# Patient Record
Sex: Female | Born: 1990 | Race: White | Hispanic: No | Marital: Single | Smoking: Never smoker
Health system: Southern US, Community
[De-identification: ages and names within clinical notes are randomized; demographics above are authoritative.]

## PROBLEM LIST (undated history)

## (undated) DIAGNOSIS — I10 Essential (primary) hypertension: Secondary | ICD-10-CM

## (undated) HISTORY — DX: Essential (primary) hypertension: I10

## (undated) HISTORY — PX: WISDOM TOOTH EXTRACTION: SHX21

---

## 2017-07-10 ENCOUNTER — Other Ambulatory Visit: Payer: Self-pay | Admitting: Family Medicine

## 2017-07-10 DIAGNOSIS — R109 Unspecified abdominal pain: Secondary | ICD-10-CM

## 2017-07-16 ENCOUNTER — Ambulatory Visit
Admission: RE | Admit: 2017-07-16 | Discharge: 2017-07-16 | Disposition: A | Discharge status: Home / Self Care | Payer: PRIVATE HEALTH INSURANCE | Source: Ambulatory Visit | Attending: Family Medicine | Admitting: Family Medicine

## 2017-07-16 DIAGNOSIS — R109 Unspecified abdominal pain: Secondary | ICD-10-CM

## 2017-07-24 ENCOUNTER — Encounter: Payer: Self-pay | Admitting: General Surgery

## 2017-08-07 ENCOUNTER — Encounter: Payer: Self-pay | Admitting: *Deleted

## 2017-08-09 ENCOUNTER — Encounter: Payer: Self-pay | Admitting: General Surgery

## 2017-08-09 ENCOUNTER — Ambulatory Visit: Payer: PRIVATE HEALTH INSURANCE | Admitting: General Surgery

## 2017-08-09 VITALS — BP 152/86 | HR 90 | Resp 14 | Ht 66.0 in | Wt 249.0 lb

## 2017-08-09 DIAGNOSIS — K8020 Calculus of gallbladder without cholecystitis without obstruction: Secondary | ICD-10-CM

## 2017-08-09 NOTE — Progress Notes (Signed)
Patient ID: Tiffany Harrington, female   DOB: 10/04/1990, 26 y.o.   MRN: [id number]  Chief Complaint  Patient presents with  . Abdominal Pain    HPI Tiffany Harrington is a 26 y.o. female.  Here for evaluation of gall stones referred by [name] PA. She states she has had at least 3 episodes of right upper abdominal pain this year starting before the summer. The pain last at least a day and radiates to her back. She thinks it is triggered by spicy or fatty foods. Her last episode was 6 weeks ago. Ultrasound was 07-16-17. She states the vomiting can be during the night or early am.  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  HPI  Past Medical History:  Diagnosis Date  . Hypertension       Family History  Problem Relation Age of Onset  . Diabetes Mother     Social History Social History   Tobacco Use  . Smoking status: Never Smoker  . Smokeless tobacco: Never Used  Substance Use Topics  . Alcohol use: No    Frequency: Never  . Drug use: No    No Known Allergies  Current Outpatient Medications  Medication Sig Dispense Refill  . amphetamine-dextroamphetamine (ADDERALL XR) 15 MG 24 hr capsule Take 15 mg by mouth.    . hydrochlorothiazide (HYDRODIURIL) 25 MG tablet Take 25 mg by mouth daily.    . norgestimate-ethinyl estradiol (ORTHO-CYCLEN,SPRINTEC,PREVIFEM) 0.25-35 MG-MCG tablet Take by mouth.     No current facility-administered medications for this visit.     Review of Systems Review of Systems  Constitutional: Negative.   Respiratory: Negative.   Cardiovascular: Negative.   Gastrointestinal: Positive for abdominal pain, constipation and diarrhea.    Blood pressure (!) 152/86, pulse 90, resp. rate 14, height 5\' 6"  (1.676 m), weight 249 lb (112.9 kg), last menstrual period 08/03/2017, SpO2 98 %.  Physical Exam Physical Exam  Constitutional: She is oriented to person, place, and time. She appears well-developed and  well-nourished.  HENT:  Mouth/Throat: Oropharynx is clear and moist.  Eyes: Conjunctivae are normal. No scleral icterus.  Neck: Neck supple.  Cardiovascular: Normal rate, regular rhythm and normal heart sounds.  Pulmonary/Chest: Effort normal and breath sounds normal.  Abdominal: Soft. Bowel sounds are normal. There is tenderness.  Lymphadenopathy:    She has no cervical adenopathy.  Neurological: She is alert and oriented to person, place, and time.  Skin: Skin is warm and dry.  Psychiatric: Her behavior is normal.    Data Reviewed 07/16/2017 abdominal ultrasound: IMPRESSION: 1.4 cm gallstone in the gallbladder neck which does not move with change in positioning. No ultrasound evidence of cholecystitis or frank obstruction presently. No associated laboratory studies available.  Assessment    Symptomatic cholelithiasis.    Plan    Options for management reviewed. Low likelihood of success with oral dissolution therapy reviewed large stone.  Patient is up in the area at this time whether she wants to proceed with cholecystectomy.    Laparoscopic Cholecystectomy with Intraoperative Cholangiogram. The procedure, including it's potential risks and complications (including but not limited to infection, bleeding, injury to intra-abdominal organs or bile ducts, bile leak, poor cosmetic result, sepsis and death) were discussed with the patient in detail. Non-operative options, including their inherent risks (acute calculous cholecystitis with possible choledocholithiasis or gallstone pancreatitis, with the risk of ascending cholangitis, sepsis, and death) were discussed as well. The patient expressed and understanding of what we  discussed and wishes to callback when she wishes to proceed with laparoscopic cholecystectomy. The patient further understands that if it is technically not possible, or it is unsafe to proceed laparoscopically, that I will convert to an open  cholecystectomy.  HPI, Physical Exam, Assessment and Plan have been scribed under the direction and in the presence of Earline MayotteJeffrey W. Ryota Treece, MD. Dorathy DaftMarsha Hatch, RN  .Warren Danesasa3  Lona Six, Merrily PewJeffrey W 08/09/2017, 8:25 PM  The patient wishes to think about surgery at this time and call the office if she would like to proceed. She was provided with phone interview instructions today.   Nicholes MangoMichele J. Bailey, CMA

## 2017-08-09 NOTE — Patient Instructions (Signed)

## 2017-12-26 ENCOUNTER — Ambulatory Visit: Payer: Self-pay | Admitting: Family Medicine

## 2018-03-29 ENCOUNTER — Emergency Department (HOSPITAL_COMMUNITY)
Admission: EM | Admit: 2018-03-29 | Discharge: 2018-03-30 | Disposition: A | Discharge status: Home / Self Care | Payer: PRIVATE HEALTH INSURANCE | Attending: Emergency Medicine | Admitting: Emergency Medicine

## 2018-03-29 ENCOUNTER — Other Ambulatory Visit: Payer: Self-pay

## 2018-03-29 ENCOUNTER — Emergency Department (HOSPITAL_COMMUNITY): Payer: PRIVATE HEALTH INSURANCE

## 2018-03-29 ENCOUNTER — Encounter (HOSPITAL_COMMUNITY): Payer: Self-pay | Admitting: Emergency Medicine

## 2018-03-29 DIAGNOSIS — I10 Essential (primary) hypertension: Secondary | ICD-10-CM | POA: Insufficient documentation

## 2018-03-29 DIAGNOSIS — R079 Chest pain, unspecified: Secondary | ICD-10-CM | POA: Diagnosis present

## 2018-03-29 DIAGNOSIS — Z79899 Other long term (current) drug therapy: Secondary | ICD-10-CM | POA: Diagnosis not present

## 2018-03-29 DIAGNOSIS — R0789 Other chest pain: Secondary | ICD-10-CM | POA: Insufficient documentation

## 2018-03-29 LAB — I-STAT TROPONIN, ED: Troponin i, poc: 0 ng/mL (ref 0.00–0.08)

## 2018-03-29 LAB — CBC
HCT: 43.4 % (ref 36.0–46.0)
HEMOGLOBIN: 14.9 g/dL (ref 12.0–15.0)
MCH: 29.8 pg (ref 26.0–34.0)
MCHC: 34.3 g/dL (ref 30.0–36.0)
MCV: 86.8 fL (ref 78.0–100.0)
Platelets: 442 10*3/uL — ABNORMAL HIGH (ref 150–400)
RBC: 5 MIL/uL (ref 3.87–5.11)
RDW: 13.7 % (ref 11.5–15.5)
WBC: 10.9 10*3/uL — ABNORMAL HIGH (ref 4.0–10.5)

## 2018-03-29 LAB — I-STAT BETA HCG BLOOD, ED (MC, WL, AP ONLY): I-stat hCG, quantitative: <5 m[IU]/mL (ref <5)

## 2018-03-29 NOTE — ED Triage Notes (Signed)
Patient is having right chest pain, sob, and and bp is elevated. Patient states this started around 2 hours ago. Patient is in a panicky state.

## 2018-03-30 LAB — BASIC METABOLIC PANEL
Anion gap: 11 (ref 5–15)
BUN: 15 mg/dL (ref 6–20)
CHLORIDE: 107 mmol/L (ref 98–111)
CO2: 25 mmol/L (ref 22–32)
Calcium: 9.9 mg/dL (ref 8.9–10.3)
Creatinine, Ser: 0.73 mg/dL (ref 0.44–1.00)
Glucose, Bld: 150 mg/dL — ABNORMAL HIGH (ref 70–99)
Potassium: 3.2 mmol/L — ABNORMAL LOW (ref 3.5–5.1)
SODIUM: 143 mmol/L (ref 135–145)

## 2018-03-30 LAB — I-STAT TROPONIN, ED: TROPONIN I, POC: 0 ng/mL (ref 0.00–0.08)

## 2018-03-30 NOTE — ED Provider Notes (Addendum)
[facility]-EMERGENCY DEPT Provider Note  CSN: [id number] Arrival date & time: 03/29/18 2056  Chief Complaint(s) Chest Pain  HPI Tiffany Harrington is a 27 y.o. female   The history is provided by the patient.  Chest Pain   This is a new problem. The current episode started 3 to 5 hours ago. The problem occurs constantly. The problem has been resolved. Pain location: right upper chest. The pain is moderate. Quality: throbbing. The pain does not radiate. Associated symptoms include shortness of breath. Pertinent negatives include no back pain, no cough, no dizziness, no exertional chest pressure, no fever, no leg pain, no lower extremity edema and no malaise/fatigue. Risk factors include obesity.  Her past medical history is significant for hypertension.  Pertinent negatives for past medical history include no diabetes, no DVT, no hyperlipidemia and no TIA.    Past Medical History Past Medical History:  Diagnosis Date  . Hypertension    Patient Active Problem List   Diagnosis Date Noted  . Calculus of gallbladder without cholecystitis without obstruction 08/09/2017   Home Medication(s) Prior to Admission medications   Medication Sig Start Date End Date Taking? Authorizing Provider  amphetamine-dextroamphetamine (ADDERALL XR) 15 MG 24 hr capsule Take 15 mg by mouth. 12/27/16  Yes [provider]  hydrochlorothiazide (HYDRODIURIL) 25 MG tablet Take 25 mg by mouth daily.   Yes [provider]  norgestimate-ethinyl estradiol (ORTHO-CYCLEN,SPRINTEC,PREVIFEM) 0.25-35 MG-MCG tablet Take by mouth. 12/27/16 03/30/18 Yes [provider]                                                                                                                                    Past Surgical History Past Surgical History:  Procedure Laterality Date  . WISDOM TOOTH EXTRACTION     Family History Family History  Problem Relation Age of Onset  . Diabetes Mother      Social History Social History   Tobacco Use  . Smoking status: Never Smoker  . Smokeless tobacco: Never Used  Substance Use Topics  . Alcohol use: No    Frequency: Never  . Drug use: No   Allergies Patient has no known allergies.  Review of Systems Review of Systems  Constitutional: Negative for fever and malaise/fatigue.  Respiratory: Positive for shortness of breath. Negative for cough.   Cardiovascular: Positive for chest pain.  Musculoskeletal: Negative for back pain.  Neurological: Negative for dizziness.   All other systems are reviewed and are negative for acute change except as noted in the HPI  Physical Exam Vital Signs  I have reviewed the triage vital signs BP (!) 156/93 (BP Location: Left Arm)   Pulse 82   Temp 97.9 F (36.6 C) (Oral)   Resp 18   Ht 5\' 7"  (1.702 m)   Wt 117.9 kg (260 lb)   LMP 03/29/2018   SpO2 100%   BMI 40.72 kg/m   Physical Exam  Constitutional: She is oriented to person, place, and time. She appears well-developed and well-nourished. No distress.  obese  HENT:  Head: Normocephalic and atraumatic.  Nose: Nose normal.  Eyes: Pupils are equal, round, and reactive to light. Conjunctivae and EOM are normal. Right eye exhibits no discharge. Left eye exhibits no discharge. No scleral icterus.  Neck: Normal range of motion. Neck supple.  Cardiovascular: Normal rate and regular rhythm. Exam reveals no gallop and no friction rub.  No murmur heard. Pulmonary/Chest: Effort normal and breath sounds normal. No stridor. No respiratory distress. She has no rales.  Abdominal: Soft. She exhibits no distension. There is no tenderness.  Musculoskeletal: She exhibits no edema or tenderness.  Neurological: She is alert and oriented to person, place, and time.  Skin: Skin is warm and dry. No rash noted. She is not diaphoretic. No erythema.  Psychiatric: She has a normal mood and affect.  Vitals reviewed.   ED Results and Treatments Labs (all  labs ordered are listed, but only abnormal results are displayed) Labs Reviewed  BASIC METABOLIC PANEL - Abnormal; Notable for the following components:      Result Value   Potassium 3.2 (*)    Glucose, Bld 150 (*)    All other components within normal limits  CBC - Abnormal; Notable for the following components:   WBC 10.9 (*)    Platelets 442 (*)    All other components within normal limits  I-STAT TROPONIN, ED  I-STAT BETA HCG BLOOD, ED ([facility], [facility], [facility] ONLY)  I-STAT TROPONIN, ED                                                                                                                         EKG  EKG Interpretation  Date/Time:  Friday March 29 2018 22:00:43 EDT Ventricular Rate:  91 PR Interval:    QRS Duration: 83 QT Interval:  373 QTC Calculation: 459 R Axis:   58 Text Interpretation:  Sinus rhythm NO STEMI No old tracing to compare Confirmed by Drema Pryardama, Lucianna Ostlund [phone]([id number]) on 03/30/2018 12:10:57 AM      Radiology Dg Chest 2 View  Result Date: 03/29/2018 CLINICAL DATA:  Right chest pain.  Shortness of breath. EXAM: CHEST - 2 VIEW COMPARISON:  None. FINDINGS: The heart, hila, and mediastinum are normal. No pneumothorax. No pulmonary nodules or masses. Mild bibasilar atelectasis. No focal infiltrate. IMPRESSION: No active cardiopulmonary disease. Electronically Signed   By: Gerome Samavid  Williams III M.D   On: 03/29/2018 21:28   Pertinent labs & imaging results that were available during my care of the patient were reviewed by me and considered in my medical decision making (see chart for details).  Medications Ordered in ED Medications - No data to display  Procedures Procedures  (including critical care time)  Medical Decision Making / ED Course I have reviewed the nursing notes for this encounter and the patient's prior records (if available in EHR or on  provided paperwork).    Atypical chest pain highly inconsistent with ACS.  EKG without acute ischemic changes or evidence of pericarditis.  Initial troponin negative.  Heart score less than 4.  Appropriate for delta troponin.  Low suspicion for pulmonary embolism.  Presentation not classic for aortic dissection or esophageal perforation.  Chest x-ray without evidence suggestive of pneumonia, pneumothorax, pneumomediastinum.  No abnormal contour of the mediastinum to suggest dissection. No evidence of acute injuries.  Labs reassuring without significant leukocytosis.  No anemia.  No significant electrolyte derangements or renal insufficiency.  Delta troponin negative.  The patient appears reasonably screened and/or stabilized for discharge and I doubt any other medical condition or other [facility] requiring further screening, evaluation, or treatment in the ED at this time prior to discharge.  The patient is safe for discharge with strict return precautions.   Final Clinical Impression(s) / ED Diagnoses Final diagnoses:  Other chest pain    Disposition: Discharge  Condition: Good  I have discussed the results, Dx and Tx plan with the patient who expressed understanding and agree(s) with the plan. Discharge instructions discussed at great length. The patient was given strict return precautions who verbalized understanding of the instructions. No further questions at time of discharge.    ED Discharge Orders    None       Follow Up: Clide Dales, [place] [address] [place] [address] [phone]  Schedule an appointment as soon as possible for a visit  As needed     This chart was dictated using voice recognition software.  Despite best efforts to proofread,  errors can occur which can change the documentation meaning.   Nira Conn, MD 03/30/18 [id number]    Nira Conn, MD 04/05/18 2225

## 2018-04-05 ENCOUNTER — Encounter: Payer: Self-pay | Admitting: Physician Assistant

## 2018-04-05 ENCOUNTER — Ambulatory Visit: Payer: PRIVATE HEALTH INSURANCE | Admitting: Physician Assistant

## 2018-04-05 VITALS — BP 158/102 | HR 100 | Temp 98.4°F | Ht 68.0 in | Wt 241.0 lb

## 2018-04-05 DIAGNOSIS — R41840 Attention and concentration deficit: Secondary | ICD-10-CM

## 2018-04-05 DIAGNOSIS — I10 Essential (primary) hypertension: Secondary | ICD-10-CM | POA: Diagnosis not present

## 2018-04-05 MED ORDER — LABETALOL HCL 100 MG PO TABS: 100.0000 mg | ORAL_TABLET | Freq: Two times a day (BID) | ORAL | 0 refills | Status: DC

## 2018-04-05 NOTE — Progress Notes (Signed)
Patient: Tiffany Harrington Female    DOB: 09/23/1990   27 y.o.   MRN: [id number] Visit Date: 05/09/2018  Today's Provider: Trey SailorsAdriana M Pollak, PA-C   Chief Complaint  Patient presents with  . Establish Care  . ADHD  . 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 Thinks she may be able to get records of evaluation.   BP Readings from Last 3 Encounters:  04/05/18 (!) 158/102  03/30/18 (!) 156/93  08/09/17 (!) 152/86     Hypertension  This is a chronic problem. The problem is unchanged. The problem is uncontrolled (130/100's ). Pertinent negatives include no anxiety, blurred vision, chest pain, headaches, malaise/fatigue, neck pain, orthopnea, palpitations, peripheral edema, PND, shortness of breath or sweats. There are no associated agents to hypertension. Past treatments include diuretics. Compliance problems include medication side effects.        No Known Allergies   Current Outpatient Medications:  .  amphetamine-dextroamphetamine (ADDERALL XR) 15 MG 24 hr capsule, Take 15 mg by mouth., Disp: , Rfl:  .  labetalol (NORMODYNE) 100 MG tablet, Take 1 tablet (100 mg total) by mouth 2 (two) times daily., Disp: 180 tablet, Rfl: 0 .  norgestimate-ethinyl estradiol (ORTHO-CYCLEN,SPRINTEC,PREVIFEM) 0.25-35 MG-MCG tablet, Take by mouth., Disp: , Rfl:   Review of Systems  Constitutional: Negative for malaise/fatigue.  Eyes: Negative for blurred vision.  Respiratory: Negative for shortness of breath.   Cardiovascular: Negative for chest pain, palpitations, orthopnea and PND.  Musculoskeletal: Negative for neck pain.  Neurological: Negative for headaches.    Social History   Tobacco Use  . Smoking  status: Never Smoker  . Smokeless tobacco: Never Used  Substance Use Topics  . Alcohol use: No    Frequency: Never   Objective:   BP (!) 158/102 (BP Location: Left Arm, Patient Position: Sitting, Cuff Size: Large)   Pulse 100   Temp 98.4 F (36.9 C) (Oral)   Ht 5\' 8"  (1.727 m)   Wt 241 lb (109.3 kg)   LMP 03/29/2018   SpO2 96%   BMI 36.64 kg/m  Vitals:   04/05/18 1505  BP: (!) 158/102  Pulse: 100  Temp: 98.4 F (36.9 C)  TempSrc: Oral  SpO2: 96%  Weight: 241 lb (109.3 kg)  Height: 5\' 8"  (1.727 m)     Physical Exam  Constitutional: She is oriented to person, place, and time. She appears well-developed and well-nourished.  Cardiovascular: Normal rate and regular rhythm.  Pulmonary/Chest: Effort normal and breath sounds normal.  Abdominal: Soft. Bowel sounds are normal.  Neurological: She is alert and oriented to person, place, and time.  Skin: Skin is warm and dry.  Psychiatric: She has a normal mood and affect. Her behavior is normal.        Assessment & Plan:     1. Hypertension, unspecified type  Start as below.   - labetalol (NORMODYNE) 100 MG tablet; Take 1 tablet (100 mg total) by mouth 2 (two) times daily.  Dispense: 180 tablet; Refill: 0  2. Attention deficit  She will need an evaluation or old records of her ADHD diagnosis for me to prescribe her Adderall XR.   No follow-ups on file.  The entirety of the information documented in the History of Present Illness, Review of Systems and Physical Exam were personally obtained by me. Portions of this information were initially documented by Ashley Royalty, CMA and reviewed by me for thoroughness and accuracy.          Trinna Post, PA-C  [facility]

## 2018-04-10 ENCOUNTER — Ambulatory Visit: Payer: Self-pay | Admitting: Physician Assistant

## 2018-06-10 ENCOUNTER — Ambulatory Visit: Payer: PRIVATE HEALTH INSURANCE | Admitting: Physician Assistant

## 2018-06-10 ENCOUNTER — Encounter: Payer: Self-pay | Admitting: Physician Assistant

## 2018-06-10 VITALS — BP 150/96 | HR 82 | Temp 98.2°F | Resp 16 | Ht 67.0 in | Wt 249.0 lb

## 2018-06-10 DIAGNOSIS — Z3002 Counseling and instruction in natural family planning to avoid pregnancy: Secondary | ICD-10-CM

## 2018-06-10 DIAGNOSIS — F909 Attention-deficit hyperactivity disorder, unspecified type: Secondary | ICD-10-CM | POA: Diagnosis not present

## 2018-06-10 DIAGNOSIS — Z23 Encounter for immunization: Secondary | ICD-10-CM

## 2018-06-10 DIAGNOSIS — I10 Essential (primary) hypertension: Secondary | ICD-10-CM | POA: Diagnosis not present

## 2018-06-10 MED ORDER — AMPHETAMINE-DEXTROAMPHET ER 15 MG PO CP24: 15.0000 mg | ORAL_CAPSULE | ORAL | 0 refills | Status: DC

## 2018-06-10 MED ORDER — NORETHINDRONE 0.35 MG PO TABS: 1.0000 | ORAL_TABLET | Freq: Every day | ORAL | 11 refills | Status: DC

## 2018-06-10 NOTE — Progress Notes (Signed)
Patient: Tiffany Harrington Female    DOB: 11-Jun-1991   27 y.o.   MRN: [id number] Visit Date: 06/10/2018  Today's Provider: Trey Sailors, PA-C   Chief Complaint  Patient presents with  . Hypertension  . ADHD   Subjective:    HPI  Follow up for hypertension  The patient was last seen for this 1 months ago. Changes made at last visit include start labetalol 100 mg BID.  She reports excellent compliance with treatment. She feels that condition is Improved. She is not having side effects. Patient denies chest pain, shortness of breath or swelling around ankles. Patient is not exercing and does not follow a low salt diet,  BP Readings from Last 3 Encounters:  06/10/18 (!) 150/96  04/05/18 (!) 158/102  03/30/18 (!) 156/93   Upon reviewing her chart, she was normotensive in 2015 via Care Everywhere. In 2015 she started combination OCP and several months later her blood pressure was elevated.   ADHD: Patient brings prior ADHD evaluation records. She is currently on Adderall XR 15 mg daily which she reports she has been stable on for several years. She reports this helps her focus and do her work. She denies chest pain, palpitations, anorexia and insomnia.  ------------------------------------------------------------------------------------     No Known Allergies   Current Outpatient Medications:  .  amphetamine-dextroamphetamine (ADDERALL XR) 15 MG 24 hr capsule, Take 15 mg by mouth., Disp: , Rfl:  .  labetalol (NORMODYNE) 100 MG tablet, Take 1 tablet (100 mg total) by mouth 2 (two) times daily., Disp: 180 tablet, Rfl: 0 .  norgestimate-ethinyl estradiol (ORTHO-CYCLEN,SPRINTEC,PREVIFEM) 0.25-35 MG-MCG tablet, Take by mouth., Disp: , Rfl:   Review of Systems  Constitutional: Negative.   Respiratory: Negative.   Cardiovascular: Negative.   Psychiatric/Behavioral: Negative.     Social History   Tobacco Use  . Smoking status: Never Smoker  . Smokeless tobacco: Never  Used  Substance Use Topics  . Alcohol use: No    Frequency: Never   Objective:   BP (!) 150/96 (BP Location: Left Arm, Patient Position: Sitting, Cuff Size: Large)   Pulse 82   Temp 98.2 F (36.8 C) (Oral)   Resp 16   Ht 5\' 7"  (1.702 m)   Wt 249 lb (112.9 kg)   SpO2 98%   BMI 39.00 kg/m  Vitals:   06/10/18 1611  BP: (!) 150/96  Pulse: 82  Resp: 16  Temp: 98.2 F (36.8 C)  TempSrc: Oral  SpO2: 98%  Weight: 249 lb (112.9 kg)  Height: 5\' 7"  (1.702 m)     Physical Exam  Constitutional: She is oriented to person, place, and time. She appears well-developed and well-nourished.  Cardiovascular: Normal rate and regular rhythm.  Pulmonary/Chest: Effort normal and breath sounds normal.  Neurological: She is alert and oriented to person, place, and time.  Skin: Skin is warm and dry.  Psychiatric: She has a normal mood and affect. Her behavior is normal.        Assessment & Plan:     1. Encounter for counseling and instruction in natural family planning to avoid pregnancy  She was normotensive until starting combination OCP. Will switch to progesterone only and see if this has any effect on her blood pressure. In the mean time, she should continue the labetalol.  - norethindrone (CAMILA) 0.35 MG tablet; Take 1 tablet (0.35 mg total) by mouth daily.  Dispense: 1 Package; Refill: 11  2. Attention deficit hyperactivity disorder (ADHD),  unspecified ADHD type  Will prescribe Adderall XR 15 mg daily. She will need to sign controlled substance agreement at next visit. Check ups every 3 months for this medication.  - amphetamine-dextroamphetamine (ADDERALL XR) 15 MG 24 hr capsule; Take 1 capsule by mouth every morning.  Dispense: 30 capsule; Refill: 0  3. Hypertension, unspecified type  See #1.  Return in about 3 months (around 09/10/2018) for CPE, F/u .  The entirety of the information documented in the History of Present Illness, Review of Systems and Physical Exam were  personally obtained by me. Portions of this information were initially documented by Rondel Baton, CMA and reviewed by me for thoroughness and accuracy.          Trey Sailors, PA-C  [facility]

## 2018-06-10 NOTE — Patient Instructions (Signed)

## 2018-06-11 ENCOUNTER — Ambulatory Visit: Payer: PRIVATE HEALTH INSURANCE | Admitting: Physician Assistant

## 2018-08-15 ENCOUNTER — Other Ambulatory Visit (HOSPITAL_COMMUNITY)
Admission: RE | Admit: 2018-08-15 | Discharge: 2018-08-15 | Disposition: A | Discharge status: Home / Self Care | Payer: PRIVATE HEALTH INSURANCE | Source: Ambulatory Visit | Attending: Physician Assistant | Admitting: Physician Assistant

## 2018-08-15 ENCOUNTER — Ambulatory Visit (INDEPENDENT_AMBULATORY_CARE_PROVIDER_SITE_OTHER): Payer: PRIVATE HEALTH INSURANCE | Admitting: Physician Assistant

## 2018-08-15 ENCOUNTER — Encounter: Payer: Self-pay | Admitting: Physician Assistant

## 2018-08-15 VITALS — BP 163/106 | HR 100 | Temp 98.6°F

## 2018-08-15 DIAGNOSIS — Z1329 Encounter for screening for other suspected endocrine disorder: Secondary | ICD-10-CM

## 2018-08-15 DIAGNOSIS — Z124 Encounter for screening for malignant neoplasm of cervix: Secondary | ICD-10-CM | POA: Diagnosis not present

## 2018-08-15 DIAGNOSIS — Z30011 Encounter for initial prescription of contraceptive pills: Secondary | ICD-10-CM

## 2018-08-15 DIAGNOSIS — Z13220 Encounter for screening for lipoid disorders: Secondary | ICD-10-CM

## 2018-08-15 DIAGNOSIS — R7303 Prediabetes: Secondary | ICD-10-CM | POA: Diagnosis not present

## 2018-08-15 DIAGNOSIS — I10 Essential (primary) hypertension: Secondary | ICD-10-CM | POA: Diagnosis not present

## 2018-08-15 DIAGNOSIS — Z0000 Encounter for general adult medical examination without abnormal findings: Secondary | ICD-10-CM | POA: Diagnosis not present

## 2018-08-15 DIAGNOSIS — Z130 Encounter for screening for diseases of the blood and blood-forming organs and certain disorders involving the immune mechanism: Secondary | ICD-10-CM

## 2018-08-15 MED ORDER — NORETHINDRONE 0.35 MG PO TABS: 1.0000 | ORAL_TABLET | Freq: Every day | ORAL | 3 refills | Status: DC

## 2018-08-15 NOTE — Progress Notes (Signed)
Patient: Tiffany Harrington, Female    DOB: 10/26/1990, 27 y.o.   MRN: [id number] Visit Date: 08/19/2018  Today's Provider: Trey Sailors, PA-C   Chief Complaint  Patient presents with  . Annual Exam   Subjective:    Annual physical exam Tiffany Harrington is a 27 y.o. female who presents today for health maintenance and complete physical. She feels fairly well. She reports exercising includes walking. She reports she is sleeping well.  PAP: She had a normal PAP last year but reports he insurance may be requiring her to do this.   HTN: At our last visit, She was being treated for HTN with labetalol 100 mg BID. Her BP was uncontrolled. We discussed that since she was on combined OCP and her HTN started after she began this medication that it would be reasonable to stop her OCP and recheck her blood pressure. She was switched to Camila for contracaption. Unfortunately she reports when she went to fill the rx the pharmacy told her they did not have script for camila but they did for combined OCP so she refilled this and is still taking this medication.   BP Readings from Last 3 Encounters:  08/15/18 (!) 163/106  06/10/18 (!) 150/96  04/05/18 (!) 158/102   Contraception: See above.   Prediabetes: Hemoglobin from [facility] 11/29/2017 shows prediabetes with A1c at 6.0%.   Lab Results  Component Value Date   HGBA1C 6.4 (H) 08/15/2018     -----------------------------------------------------------------   Review of Systems  Constitutional: Negative.   HENT: Positive for congestion, sneezing and sore throat.   Eyes: Negative.   Respiratory: Negative.   Cardiovascular: Negative.   Gastrointestinal: Negative.   Endocrine: Negative.   Genitourinary: Negative.   Musculoskeletal: Negative.   Skin: Negative.   Allergic/Immunologic: Negative.   Neurological: Negative.   Hematological: Negative.   Psychiatric/Behavioral: Negative.     Social History She  reports that she has never  smoked. She has never used smokeless tobacco. She reports that she does not drink alcohol or use drugs. Social History   Socioeconomic History  . Marital status: Single    Spouse name: Not on file  . Number of children: Not on file  . Years of education: Not on file  . Highest education level: Not on file  Occupational History  . Not on file  Social Needs  . Financial resource strain: Not on file  . Food insecurity:    Worry: Not on file    Inability: Not on file  . Transportation needs:    Medical: Not on file    Non-medical: Not on file  Tobacco Use  . Smoking status: Never Smoker  . Smokeless tobacco: Never Used  Substance and Sexual Activity  . Alcohol use: No    Frequency: Never  . Drug use: No  . Sexual activity: Not on file  Lifestyle  . Physical activity:    Days per week: Not on file    Minutes per session: Not on file  . Stress: Not on file  Relationships  . Social connections:    Talks on phone: Not on file    Gets together: Not on file    Attends religious service: Not on file    Active member of club or organization: Not on file    Attends meetings of clubs or organizations: Not on file    Relationship status: Not on file  Other Topics Concern  . Not on file  Social  History Narrative  . Not on file    Patient Active Problem List   Diagnosis Date Noted  . Prediabetes 08/19/2018  . Hypertension 08/19/2018  . Calculus of gallbladder without cholecystitis without obstruction 08/09/2017    Past Surgical History:  Procedure Laterality Date  . WISDOM TOOTH EXTRACTION      Family History  Family Status  Relation Name Status  . Mother  Alive  . Father  Alive  . MGM  Deceased  . PGF  Deceased  . Mat Aunt  Alive   Her family history includes Alzheimer's disease in her maternal grandmother and paternal grandfather; Breast cancer in her maternal aunt; Diabetes in her mother; Healthy in her father.     No Known Allergies  Previous Medications    AMPHETAMINE-DEXTROAMPHETAMINE (ADDERALL XR) 15 MG 24 HR CAPSULE    Take 1 capsule by mouth every morning.   LABETALOL (NORMODYNE) 100 MG TABLET    Take 1 tablet (100 mg total) by mouth 2 (two) times daily.   NORGESTIMATE-ETHINYL ESTRADIOL (ORTHO-CYCLEN,SPRINTEC,PREVIFEM) 0.25-35 MG-MCG TABLET    Take by mouth.    Patient Care Team: Maryella Shivers as PCP - General (Physician Assistant)      Objective:   Vitals: BP (!) 163/106 (BP Location: Left Arm, Patient Position: Sitting, Cuff Size: Large)   Pulse 100   Temp 98.6 F (37 C) (Oral)   LMP 08/01/2018   SpO2 98%    Physical Exam Constitutional:      General: She is not in acute distress. HENT:     Right Ear: Tympanic membrane and ear canal normal.     Left Ear: Tympanic membrane and ear canal normal.     Mouth/Throat:     Mouth: Mucous membranes are moist.     Pharynx: Oropharynx is clear.  Neck:     Musculoskeletal: Normal range of motion and neck supple.  Cardiovascular:     Rate and Rhythm: Normal rate.     Heart sounds: Normal heart sounds. No murmur.  Pulmonary:     Effort: Pulmonary effort is normal.     Breath sounds: Normal breath sounds.  Chest:     Breasts:        Right: Normal. No swelling, bleeding, inverted nipple, mass, nipple discharge, skin change or tenderness.        Left: Normal. No swelling, bleeding, inverted nipple, mass, nipple discharge, skin change or tenderness.  Abdominal:     General: Bowel sounds are normal.     Palpations: Abdomen is soft.     Tenderness: There is no abdominal tenderness.  Genitourinary:    Labia:        Right: No rash, tenderness, lesion or injury.        Left: No rash, tenderness, lesion or injury.      Vagina: Normal.     Cervix: Normal.     Uterus: Normal.      Adnexa: Right adnexa normal and left adnexa normal.  Neurological:     Mental Status: She is alert and oriented to person, place, and time. Mental status is at baseline.  Psychiatric:        Mood  and Affect: Mood normal.        Behavior: Behavior normal.      Depression Screen PHQ 2/9 Scores 08/15/2018  PHQ - 2 Score 0  PHQ- 9 Score 2      Assessment & Plan:     Routine Health Maintenance and Physical Exam  Exercise Activities and Dietary recommendations Goals   None     Immunization History  Administered Date(s) Administered  . Influenza Split 08/07/2013  . Influenza,inj,Quad PF,6+ Mos 08/17/2015, 06/15/2016, 06/10/2018  . Influenza,inj,quad, With Preservative 06/10/2014    Health Maintenance  Topic Date Due  . HIV Screening  04/08/2004  . TETANUS/TDAP  04/08/2008  . PAP-Cervical Cytology Screening  04/08/2010  . PAP SMEAR-Modifier  04/08/2010  . INFLUENZA VACCINE  Completed     Discussed health benefits of physical activity, and encouraged her to engage in regular exercise appropriate for her age and condition.    1. Annual physical exam   2. Hypertension, unspecified type  Remains uncontrolled. [facility] and [facility] on [facility]. Will resend script for camilla and she has f/u in one month to assess for this.  3. Prediabetes  Prediabetes with A1c 6.0% last march. 6.4% on todays recheck. Will discuss further at followup.   - Comprehensive metabolic panel - Hemoglobin A1c  4. Encounter for initial prescription of contraceptive pills  - norethindrone (CAMILA) 0.35 MG tablet; Take 1 tablet (0.35 mg total) by mouth daily.  Dispense: 3 Package; Refill: 3  5. Screening cholesterol level  - Lipid panel  6. Thyroid disorder screening  - TSH  7. Cervical cancer screening  - Cytology - PAP  8. Screening for deficiency anemia  - CBC with Differential/Platelet  Return in about 1 month (around 09/15/2018) for HTN.  The entirety of the information documented in the History of Present Illness, Review of Systems and Physical Exam were personally obtained by me. Portions of this information were  initially documented by Dara LordsPorsha Carpenter, CMA and reviewed by me for thoroughness and accuracy.      --------------------------------------------------------------------

## 2018-08-16 ENCOUNTER — Telehealth: Payer: Self-pay

## 2018-08-16 LAB — COMPREHENSIVE METABOLIC PANEL
ALT: 8 IU/L (ref 0–32)
AST: 14 IU/L (ref 0–40)
Albumin/Globulin Ratio: 1.4 (ref 1.2–2.2)
Albumin: 4.3 g/dL (ref 3.5–5.5)
Alkaline Phosphatase: 56 IU/L (ref 39–117)
BUN/Creatinine Ratio: 12 (ref 9–23)
BUN: 9 mg/dL (ref 6–20)
Bilirubin Total: 0.2 mg/dL (ref 0.0–1.2)
CO2: 19 mmol/L — ABNORMAL LOW (ref 20–29)
Calcium: 9.5 mg/dL (ref 8.7–10.2)
Chloride: 98 mmol/L (ref 96–106)
Creatinine, Ser: 0.73 mg/dL (ref 0.57–1.00)
GFR calc Af Amer: 129 mL/min/{1.73_m2} (ref >59)
GFR calc non Af Amer: 112 mL/min/{1.73_m2} (ref >59)
Globulin, Total: 3 g/dL (ref 1.5–4.5)
Glucose: 110 mg/dL — ABNORMAL HIGH (ref 65–99)
Potassium: 4 mmol/L (ref 3.5–5.2)
Sodium: 135 mmol/L (ref 134–144)
Total Protein: 7.3 g/dL (ref 6.0–8.5)

## 2018-08-16 LAB — LIPID PANEL
Chol/HDL Ratio: 3.1 ratio (ref 0.0–4.4)
Cholesterol, Total: 187 mg/dL (ref 100–199)
HDL: 60 mg/dL (ref >39)
LDL Calculated: 89 mg/dL (ref 0–99)
Triglycerides: 189 mg/dL — ABNORMAL HIGH (ref 0–149)
VLDL Cholesterol Cal: 38 mg/dL (ref 5–40)

## 2018-08-16 LAB — CBC WITH DIFFERENTIAL/PLATELET
Basophils Absolute: 0.1 10*3/uL (ref 0.0–0.2)
Basos: 1 %
EOS (ABSOLUTE): 0.2 10*3/uL (ref 0.0–0.4)
Eos: 2 %
Hematocrit: 42.4 % (ref 34.0–46.6)
Hemoglobin: 14.4 g/dL (ref 11.1–15.9)
Immature Grans (Abs): 0 10*3/uL (ref 0.0–0.1)
Immature Granulocytes: 0 %
Lymphocytes Absolute: 3.5 10*3/uL — ABNORMAL HIGH (ref 0.7–3.1)
Lymphs: 29 %
MCH: 29.3 pg (ref 26.6–33.0)
MCHC: 34 g/dL (ref 31.5–35.7)
MCV: 86 fL (ref 79–97)
Monocytes Absolute: 0.7 10*3/uL (ref 0.1–0.9)
Monocytes: 6 %
Neutrophils Absolute: 7.5 10*3/uL — ABNORMAL HIGH (ref 1.4–7.0)
Neutrophils: 62 %
Platelets: 536 10*3/uL — ABNORMAL HIGH (ref 150–450)
RBC: 4.92 x10E6/uL (ref 3.77–5.28)
RDW: 13.2 % (ref 12.3–15.4)
WBC: 11.9 10*3/uL — ABNORMAL HIGH (ref 3.4–10.8)

## 2018-08-16 LAB — HEMOGLOBIN A1C
Est. average glucose Bld gHb Est-mCnc: 137 mg/dL
Hgb A1c MFr Bld: 6.4 % — ABNORMAL HIGH (ref 4.8–5.6)

## 2018-08-16 LAB — TSH: TSH: 2.12 u[IU]/mL (ref 0.450–4.500)

## 2018-08-16 NOTE — Telephone Encounter (Signed)
-----   Message from Trey SailorsAdriana M Pollak, [place]PA-C sent at 08/16/2018  1:55 PM EST ----- Small white count, could be mild infection. CMET shows increased sugar, normal kidney and liver function. Cholesterol normal. TSH normal. A1c is 6.4% prediabetic range but very close to being classified as diabetes. We can discuss further strategies at her follow up but most important would be to increase exercise and decrease sugar intake like sweetened drinks, starchy foods, and processed snacks. See her in January.

## 2018-08-16 NOTE — Telephone Encounter (Signed)
Patient advised as directed below.  Thanks,  -Beyza Bellino 

## 2018-08-19 ENCOUNTER — Telehealth: Payer: Self-pay

## 2018-08-19 DIAGNOSIS — I10 Essential (primary) hypertension: Secondary | ICD-10-CM | POA: Insufficient documentation

## 2018-08-19 DIAGNOSIS — R7303 Prediabetes: Secondary | ICD-10-CM | POA: Insufficient documentation

## 2018-08-19 LAB — CYTOLOGY - PAP: Diagnosis: NEGATIVE

## 2018-08-19 NOTE — Telephone Encounter (Signed)
Patient advised as below.  

## 2018-08-19 NOTE — Telephone Encounter (Signed)
-----   Message from Trey SailorsAdriana M Pollak, [place]-C sent at 08/19/2018  2:11 PM EST ----- PAP smear is negative. Repeat 3 years.

## 2018-09-11 ENCOUNTER — Encounter: Payer: Self-pay | Admitting: Physician Assistant

## 2018-09-11 ENCOUNTER — Ambulatory Visit (INDEPENDENT_AMBULATORY_CARE_PROVIDER_SITE_OTHER): Payer: PRIVATE HEALTH INSURANCE | Admitting: Physician Assistant

## 2018-09-11 VITALS — BP 140/104 | HR 86 | Temp 98.8°F | Resp 16 | Ht 63.0 in | Wt 244.8 lb

## 2018-09-11 DIAGNOSIS — R7303 Prediabetes: Secondary | ICD-10-CM

## 2018-09-11 DIAGNOSIS — I10 Essential (primary) hypertension: Secondary | ICD-10-CM | POA: Diagnosis not present

## 2018-09-11 MED ORDER — AMLODIPINE BESYLATE 5 MG PO TABS: 5.0000 mg | ORAL_TABLET | Freq: Every day | ORAL | 0 refills | Status: DC

## 2018-09-11 NOTE — Patient Instructions (Signed)
Prediabetes Eating Plan  Prediabetes is a condition that causes blood sugar (glucose) levels to be higher than normal. This increases the risk for developing diabetes. In order to prevent diabetes from developing, your health care provider may recommend a diet and other lifestyle changes to help you:  · Control your blood glucose levels.  · Improve your cholesterol levels.  · Manage your blood pressure.  Your health care provider may recommend working with a diet and nutrition specialist (dietitian) to make a meal plan that is best for you.  What are tips for following this plan?  Lifestyle  · Set weight loss goals with the help of your health care team. It is recommended that most people with prediabetes lose 7% of their current body weight.  · Exercise for at least 30 minutes at least 5 days a week.  · Attend a support group or seek ongoing support from a mental health counselor.  · Take over-the-counter and prescription medicines only as told by your health care provider.  Reading food labels  · Read food labels to check the amount of fat, salt (sodium), and sugar in prepackaged foods. Avoid foods that have:  ? Saturated fats.  ? Trans fats.  ? Added sugars.  · Avoid foods that have more than 300 milligrams (mg) of sodium per serving. Limit your daily sodium intake to less than 2,300 mg each day.  Shopping  · Avoid buying pre-made and processed foods.  Cooking  · Cook with olive oil. Do not use butter, lard, or ghee.  · Bake, broil, grill, or boil foods. Avoid frying.  Meal planning    · Work with your dietitian to develop an eating plan that is right for you. This may include:  ? Tracking how many calories you take in. Use a food diary, notebook, or mobile application to track what you eat at each meal.  ? Using the glycemic index (GI) to plan your meals. The index tells you how quickly a food will raise your blood glucose. Choose low-GI foods. These foods take a longer time to raise blood glucose.  · Consider  following a Mediterranean diet. This diet includes:  ? Several servings each day of fresh fruits and vegetables.  ? Eating fish at least twice a week.  ? Several servings each day of whole grains, beans, nuts, and seeds.  ? Using olive oil instead of other fats.  ? Moderate alcohol consumption.  ? Eating small amounts of red meat and whole-fat dairy.  · If you have high blood pressure, you may need to limit your sodium intake or follow a diet such as the DASH eating plan. DASH is an eating plan that aims to lower high blood pressure.  What foods are recommended?  The items listed below may not be a complete list. Talk with your dietitian about what dietary choices are best for you.  Grains  Whole grains, such as whole-wheat or whole-grain breads, crackers, cereals, and pasta. Unsweetened oatmeal. Bulgur. Barley. Quinoa. Brown rice. Corn or whole-wheat flour tortillas or taco shells.  Vegetables  Lettuce. Spinach. Peas. Beets. Cauliflower. Cabbage. Broccoli. Carrots. Tomatoes. Squash. Eggplant. Herbs. Peppers. Onions. Cucumbers. Brussels sprouts.  Fruits  Berries. Bananas. Apples. Oranges. Grapes. Papaya. Mango. Pomegranate. Kiwi. Grapefruit. Cherries.  Meats and other protein foods  Seafood. Poultry without skin. Lean cuts of pork and beef. Tofu. Eggs. Nuts. Beans.  Dairy  Low-fat or fat-free dairy products, such as yogurt, cottage cheese, and cheese.  Beverages  Water.   Tea. Coffee. Sugar-free or diet soda. Seltzer water. Lowfat or no-fat milk. Milk alternatives, such as soy or almond milk.  Fats and oils  Olive oil. Canola oil. Sunflower oil. Grapeseed oil. Avocado. Walnuts.  Sweets and desserts  Sugar-free or low-fat pudding. Sugar-free or low-fat ice cream and other frozen treats.  Seasoning and other foods  Herbs. Sodium-free spices. Mustard. Relish. Low-fat, low-sugar ketchup. Low-fat, low-sugar barbecue sauce. Low-fat or fat-free mayonnaise.  What foods are not recommended?  The items listed below may not be a  complete list. Talk with your dietitian about what dietary choices are best for you.  Grains  Refined white flour and flour products, such as bread, pasta, snack foods, and cereals.  Vegetables  Canned vegetables. Frozen vegetables with butter or cream sauce.  Fruits  Fruits canned with syrup.  Meats and other protein foods  Fatty cuts of meat. Poultry with skin. Breaded or fried meat. Processed meats.  Dairy  Full-fat yogurt, cheese, or milk.  Beverages  Sweetened drinks, such as sweet iced tea and soda.  Fats and oils  Butter. Lard. Ghee.  Sweets and desserts  Baked goods, such as cake, cupcakes, pastries, cookies, and cheesecake.  Seasoning and other foods  Spice mixes with added salt. Ketchup. Barbecue sauce. Mayonnaise.  Summary  · To prevent diabetes from developing, you may need to make diet and other lifestyle changes to help control blood sugar, improve cholesterol levels, and manage your blood pressure.  · Set weight loss goals with the help of your health care team. It is recommended that most people with prediabetes lose 7 percent of their current body weight.  · Consider following a Mediterranean diet that includes plenty of fresh fruits and vegetables, whole grains, beans, nuts, seeds, fish, lean meat, low-fat dairy, and healthy oils.  This information is not intended to replace advice given to you by your health care provider. Make sure you discuss any questions you have with your health care provider.  Document Released: 01/05/2015 Document Revised: 10/25/2016 Document Reviewed: 10/25/2016  Elsevier Interactive Patient Education © 2019 Elsevier Inc.

## 2018-09-11 NOTE — Progress Notes (Signed)
Patient: Tiffany Harrington Female    DOB: 1990-09-22   28 y.o.   MRN: [id number] Visit Date: 09/11/2018  Today's Provider: Trey Sailors, PA-C   Chief Complaint  Patient presents with  . Hypertension  . Hyperglycemia   Subjective:     HPI  Hypertension, follow-up:  BP Readings from Last 3 Encounters:  09/11/18 (!) 140/104  08/15/18 (!) 163/106  06/10/18 (!) 150/96    She was last seen for hypertension 1 months ago.  BP at that visit was 163/106. Management changes since that visit include restarted on Camilla to see if combined OCP was raising her blood pressure.  She reports excellent compliance with treatment. She is not having side effects.  She is exercising. She is adherent to low salt diet.   Outside blood pressures are being checked periodically, patient reports recent blood pressure reading of 136/107. She is experiencing none.  Patient denies chest pain, chest pressure/discomfort, claudication, dyspnea, exertional chest pressure/discomfort, fatigue, irregular heart beat, lower extremity edema, near-syncope, orthopnea, palpitations, paroxysmal nocturnal dyspnea, syncope and tachypnea.   Cardiovascular risk factors include hypertension and obesity (BMI >= 30 kg/m2).  Use of agents associated with hypertension: none.     Weight trend: fluctuating a bit Wt Readings from Last 3 Encounters:  09/11/18 244 lb 12.8 oz (111 kg)  06/10/18 249 lb (112.9 kg)  04/05/18 241 lb (109.3 kg)    Current diet: well balanced    Prediabetes, Follow-up:   Lab Results  Component Value Date   HGBA1C 6.4 (H) 08/15/2018   GLUCOSE 110 (H) 08/15/2018   GLUCOSE 150 (H) 03/29/2018    Last seen for for this1 months ago.  Management since that visit includes none. Current symptoms include none and have been unchanged.  Weight trend: fluctuating a bit Prior visit with dietician: no Current diet: well balanced Current exercise: walking  Pertinent Labs:    Component Value  Date/Time   CHOL 187 08/15/2018 1526   TRIG 189 (H) 08/15/2018 1526   CHOLHDL 3.1 08/15/2018 1526   CREATININE 0.73 08/15/2018 1526    Wt Readings from Last 3 Encounters:  09/11/18 244 lb 12.8 oz (111 kg)  06/10/18 249 lb (112.9 kg)  04/05/18 241 lb (109.3 kg)    ------------------------------------------------------------------------  No Known Allergies   Current Outpatient Medications:  .  norethindrone (CAMILA) 0.35 MG tablet, Take 1 tablet (0.35 mg total) by mouth daily., Disp: 3 Package, Rfl: 3 .  amLODipine (NORVASC) 5 MG tablet, Take 1 tablet (5 mg total) by mouth daily., Disp: 90 tablet, Rfl: 0 .  amphetamine-dextroamphetamine (ADDERALL XR) 15 MG 24 hr capsule, Take 1 capsule by mouth every morning., Disp: 30 capsule, Rfl: 0 .  labetalol (NORMODYNE) 100 MG tablet, Take 1 tablet (100 mg total) by mouth 2 (two) times daily., Disp: 180 tablet, Rfl: 0  Review of Systems  Constitutional: Negative.   HENT: Negative.   Respiratory: Negative.   Cardiovascular: Negative.   Gastrointestinal: Negative.   Endocrine: Negative.   Genitourinary: Negative.   Musculoskeletal: Negative.     Social History   Tobacco Use  . Smoking status: Never Smoker  . Smokeless tobacco: Never Used  Substance Use Topics  . Alcohol use: No    Frequency: Never      Objective:   BP (!) 140/104   Pulse 86   Temp 98.8 F (37.1 C) (Oral)   Resp 16   Ht 5\' 3"  (1.6 m)   Wt 244 lb  12.8 oz (111 kg)   BMI 43.36 kg/m  Vitals:   09/11/18 1610  BP: (!) 140/104  Pulse: 86  Resp: 16  Temp: 98.8 F (37.1 C)  TempSrc: Oral  Weight: 244 lb 12.8 oz (111 kg)  Height: 5\' 3"  (1.6 m)     Physical Exam Constitutional:      Appearance: She is obese.  Cardiovascular:     Rate and Rhythm: Normal rate and regular rhythm.     Heart sounds: Normal heart sounds.  Pulmonary:     Effort: Pulmonary effort is normal.     Breath sounds: Normal breath sounds.  Skin:    General: Skin is warm and dry.    Neurological:     Mental Status: She is alert and oriented to person, place, and time. Mental status is at baseline.  Psychiatric:        Mood and Affect: Mood normal.        Behavior: Behavior normal.         Assessment & Plan    1. Hypertension, unspecified type  Patient would like medication that is taken once daily as she often forgot the second dose of labetalol. Explained that labetalol is the safest medication for reproductive age women should she become intentionally or unintentionally pregnant. However, if she is forgetting half the doses and she remains consistently on birth control, I think the risk of uncontrolled HTN outweighs the risk/likelihood of her becoming pregnant on birth control while taking a less pregnancy friendly blood pressure medication. I have told her these risks and she is agreeable to change blood pressure medications. Have instructed her that should she desire to become pregnant or find she is pregnant she must call immediately to change her BP medication.  - amLODipine (NORVASC) 5 MG tablet; Take 1 tablet (5 mg total) by mouth daily.  Dispense: 90 tablet; Refill: 0  2. Prediabetes  Have gone over in depth the process of insulin resistance and progression of prediabetes into diabetes. Have offered her metformin as a means for possible mild weight loss and to slow progression. She declines at this point, would like to work on diet and exercise.   Return in about 1 month (around 10/12/2018) for htn.  The entirety of the information documented in the History of Present Illness, Review of Systems and Physical Exam were personally obtained by me. Portions of this information were initially documented by Rondel BatonSulibeya Dimas, CMA and reviewed by me for thoroughness and accuracy.        Trey SailorsAdriana M Lachina Salsberry, PA-C  [facility]

## 2018-10-09 ENCOUNTER — Other Ambulatory Visit: Payer: Self-pay | Admitting: Physician Assistant

## 2018-10-09 DIAGNOSIS — F909 Attention-deficit hyperactivity disorder, unspecified type: Secondary | ICD-10-CM

## 2018-10-09 NOTE — Telephone Encounter (Signed)
Pt needs a refill on Adderall 15 mg  Karin Golden   Thank s Barth Kirks

## 2018-10-10 MED ORDER — AMPHETAMINE-DEXTROAMPHET ER 15 MG PO CP24: 15.0000 mg | ORAL_CAPSULE | ORAL | 0 refills | Status: DC

## 2018-10-14 ENCOUNTER — Ambulatory Visit: Payer: PRIVATE HEALTH INSURANCE | Admitting: Physician Assistant

## 2018-10-14 VITALS — BP 148/86 | HR 103 | Temp 99.3°F | Wt 239.4 lb

## 2018-10-14 DIAGNOSIS — F909 Attention-deficit hyperactivity disorder, unspecified type: Secondary | ICD-10-CM

## 2018-10-14 DIAGNOSIS — I10 Essential (primary) hypertension: Secondary | ICD-10-CM | POA: Diagnosis not present

## 2018-10-14 DIAGNOSIS — R7303 Prediabetes: Secondary | ICD-10-CM | POA: Diagnosis not present

## 2018-10-14 MED ORDER — AMLODIPINE BESYLATE 10 MG PO TABS: 10.0000 mg | ORAL_TABLET | Freq: Every day | ORAL | 0 refills | Status: DC

## 2018-10-14 NOTE — Patient Instructions (Addendum)
Call pharmacies - check if they accept online prescriptions across state lines Check cash price for adderall XR 15 mg once daily 30 tabs per month or adderall immediate release 10 mg twice daily, 60 tabs per month   Hypertension Hypertension is another name for high blood pressure. High blood pressure forces your heart to work harder to pump blood. This can cause problems over time. There are two numbers in a blood pressure reading. There is a top number (systolic) over a bottom number (diastolic). It is best to have a blood pressure below 120/80. Healthy choices can help lower your blood pressure. You may need medicine to help lower your blood pressure if:  Your blood pressure cannot be lowered with healthy choices.  Your blood pressure is higher than 130/80. Follow these instructions at home: Eating and drinking   If directed, follow the DASH eating plan. This diet includes: ? Filling half of your plate at each meal with fruits and vegetables. ? Filling one quarter of your plate at each meal with whole grains. Whole grains include whole wheat pasta, brown rice, and whole grain bread. ? Eating or drinking low-fat dairy products, such as skim milk or low-fat yogurt. ? Filling one quarter of your plate at each meal with low-fat (lean) proteins. Low-fat proteins include fish, skinless chicken, eggs, beans, and tofu. ? Avoiding fatty meat, cured and processed meat, or chicken with skin. ? Avoiding premade or processed food.  Eat less than 1,500 mg of salt (sodium) a day.  Limit alcohol use to no more than 1 drink a day for nonpregnant women and 2 drinks a day for men. One drink equals 12 oz of beer, 5 oz of wine, or 1 oz of hard liquor. Lifestyle  Work with your doctor to stay at a healthy weight or to lose weight. Ask your doctor what the best weight is for you.  Get at least 30 minutes of exercise that causes your heart to beat faster (aerobic exercise) most days of the week. This may  include walking, swimming, or biking.  Get at least 30 minutes of exercise that strengthens your muscles (resistance exercise) at least 3 days a week. This may include lifting weights or pilates.  Do not use any products that contain nicotine or tobacco. This includes cigarettes and e-cigarettes. If you need help quitting, ask your doctor.  Check your blood pressure at home as told by your doctor.  Keep all follow-up visits as told by your doctor. This is important. Medicines  Take over-the-counter and prescription medicines only as told by your doctor. Follow directions carefully.  Do not skip doses of blood pressure medicine. The medicine does not work as well if you skip doses. Skipping doses also puts you at risk for problems.  Ask your doctor about side effects or reactions to medicines that you should watch for. Contact a doctor if:  You think you are having a reaction to the medicine you are taking.  You have headaches that keep coming back (recurring).  You feel dizzy.  You have swelling in your ankles.  You have trouble with your vision. Get help right away if:  You get a very bad headache.  You start to feel confused.  You feel weak or numb.  You feel faint.  You get very bad pain in your: ? Chest. ? Belly (abdomen).  You throw up (vomit) more than once.  You have trouble breathing. Summary  Hypertension is another name for high blood pressure.  Making healthy choices can help lower blood pressure. If your blood pressure cannot be controlled with healthy choices, you may need to take medicine. This information is not intended to replace advice given to you by your health care provider. Make sure you discuss any questions you have with your health care provider. Document Released: 02/07/2008 Document Revised: 07/19/2016 Document Reviewed: 07/19/2016 Elsevier Interactive Patient Education  2019 [employer].

## 2018-10-14 NOTE — Progress Notes (Signed)
Patient: Tiffany Harrington Female    DOB: 02/23/1991 [name]  28 y.o.   MRN: [id number] Visit Date: 10/14/2018  Today's Provider: Trey SailorsAdriana M Pollak, PA-C   Chief Complaint  Patient presents with  . Hypertension   Subjective:   HPI  Hypertension, follow-up:  BP Readings from Last 3 Encounters:  10/14/18 (!) 148/86  09/11/18 (!) 140/104  08/15/18 (!) 163/106    She was last seen for hypertension 1 months ago.  BP at that visit was 140/104. Management changes since that visit include medication changed to Amlodipine 5 mg daily as she had difficulty remembering two doses of labetalol.  She reports good compliance with treatment. She is not having side effects.  She is exercising. She is adherent to low salt diet.   Outside blood pressures are have not checked. She is experiencing none.  Patient denies chest pain, chest pressure/discomfort, exertional chest pressure/discomfort, fatigue, irregular heart beat, lower extremity edema, palpitations and tachypnea.   Cardiovascular risk factors include hypertension and obesity (BMI >= 30 kg/m2).  Use of agents associated with hypertension: none.     Weight trend: fluctuating a bit Wt Readings from Last 3 Encounters:  10/14/18 239 lb 6.4 oz (108.6 kg)  09/11/18 244 lb 12.8 oz (111 kg)  06/10/18 249 lb (112.9 kg)    Current diet: well balanced  ADHD: She is on Adderall XR 15 mg daily and does well with this. This helps her focus and complete her tasks at work.   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  ------------------------------------------------------------------------   No Known Allergies   Current Outpatient Medications:  .  amLODipine (NORVASC) 5 MG tablet, Take 1 tablet (5 mg total) by mouth daily., Disp: 90 tablet, Rfl: 0 .  amphetamine-dextroamphetamine (ADDERALL XR) 15 MG 24 hr capsule, Take 1 capsule by mouth every morning for 30 days., Disp: 30 capsule,  Rfl: 0 .  norethindrone (CAMILA) 0.35 MG tablet, Take 1 tablet (0.35 mg total) by mouth daily., Disp: 3 Package, Rfl: 3 .  labetalol (NORMODYNE) 100 MG tablet, Take 1 tablet (100 mg total) by mouth 2 (two) times daily., Disp: 180 tablet, Rfl: 0  Review of Systems  Social History   Tobacco Use  . Smoking status: Never Smoker  . Smokeless tobacco: Never Used  Substance Use Topics  . Alcohol use: No    Frequency: Never      Objective:   BP (!) 148/86 (BP Location: Left Arm, Patient Position: Sitting, Cuff Size: Large)   Pulse (!) 103   Temp 99.3 F (37.4 C) (Oral)   Wt 239 lb 6.4 oz (108.6 kg)   LMP 10/02/2018   SpO2 99%   BMI 42.41 kg/m  Vitals:   10/14/18 1615  BP: (!) 148/86  Pulse: (!) 103  Temp: 99.3 F (37.4 C)  TempSrc: Oral  SpO2: 99%  Weight: 239 lb 6.4 oz (108.6 kg)     Physical Exam Constitutional:      Appearance: Normal appearance.  Neck:     Musculoskeletal: Neck supple.  Cardiovascular:     Rate and Rhythm: Normal rate and regular rhythm.     Heart sounds: Normal heart sounds.  Pulmonary:     Effort: Pulmonary effort is normal.     Breath sounds: Normal breath sounds.  Skin:    General: Skin is warm and dry.  Neurological:     Mental Status: She is  alert and oriented to person, place, and time. Mental status is at baseline.  Psychiatric:        Mood and Affect: Mood normal.        Behavior: Behavior normal.         Assessment & Plan    1. Hypertension, unspecified type  Uncontrolled but better, increase amlodipine to 10 mg daily.   - amLODipine (NORVASC) 10 MG tablet; Take 1 tablet (10 mg total) by mouth daily.  Dispense: 90 tablet; Refill: 0  2. Attention deficit hyperactivity disorder (ADHD), unspecified ADHD type  Continue Adderall XR 15 mg daily. She is moving to [place], [place] in March. Her insurance will not activate until 30 days after working. I have agreed to provide her prescriptions for 3 months until she gets established.     3. Prediabetes  Working on losing weight and exercise.   Return in about 3 months (around 01/12/2019) for htn.  The entirety of the information documented in the History of Present Illness, Review of Systems and Physical Exam were personally obtained by me. Portions of this information were initially documented by Rondel Baton, CMA and reviewed by me for thoroughness and accuracy.        Trey Sailors, PA-C  [facility]

## 2018-10-28 ENCOUNTER — Other Ambulatory Visit: Payer: Self-pay | Admitting: Physician Assistant

## 2018-10-28 DIAGNOSIS — Z30011 Encounter for initial prescription of contraceptive pills: Secondary | ICD-10-CM

## 2018-10-28 DIAGNOSIS — F909 Attention-deficit hyperactivity disorder, unspecified type: Secondary | ICD-10-CM

## 2018-10-28 DIAGNOSIS — I10 Essential (primary) hypertension: Secondary | ICD-10-CM

## 2018-10-28 MED ORDER — NORETHINDRONE 0.35 MG PO TABS: 1.0000 | ORAL_TABLET | Freq: Every day | ORAL | 3 refills | Status: AC

## 2018-10-28 MED ORDER — AMLODIPINE BESYLATE 10 MG PO TABS: 10.0000 mg | ORAL_TABLET | Freq: Every day | ORAL | 0 refills | Status: DC

## 2018-10-28 MED ORDER — AMPHETAMINE-DEXTROAMPHET ER 15 MG PO CP24: 15.0000 mg | ORAL_CAPSULE | ORAL | 0 refills | Status: DC

## 2018-10-28 NOTE — Telephone Encounter (Signed)
pt called saying she has moved but she needs a refill on. Pt said you told her that you would refill until she establishes care with another provider  Adderall XR 15 mg  Amlodipine 10 mg  Norethindrone 0.35 mg  [facility]  [address] [place]  Pt's CB#  [phone]  Thanks  Barth Kirks

## 2019-01-10 ENCOUNTER — Other Ambulatory Visit: Payer: Self-pay | Admitting: Physician Assistant

## 2019-01-10 DIAGNOSIS — F909 Attention-deficit hyperactivity disorder, unspecified type: Secondary | ICD-10-CM

## 2019-01-10 NOTE — Telephone Encounter (Signed)
Pt called while office was closed for lunch and reached the nurse line. Pt requested refill for amphetamine-dextroamphetamine (ADDERALL XR) 15 MG 24 hr capsule. No pharmacy was listed on nurse line request. Please advise. Thanks TNP

## 2019-01-13 ENCOUNTER — Other Ambulatory Visit: Payer: Self-pay

## 2019-01-13 DIAGNOSIS — F909 Attention-deficit hyperactivity disorder, unspecified type: Secondary | ICD-10-CM

## 2019-01-13 MED ORDER — AMPHETAMINE-DEXTROAMPHET ER 15 MG PO CP24: 15.0000 mg | ORAL_CAPSULE | ORAL | 0 refills | Status: AC

## 2019-01-13 NOTE — Telephone Encounter (Signed)
Have sent this in. Set her up with e-visit with [place] to renew prescriptions as she does not have PCP.

## 2019-01-13 NOTE — Telephone Encounter (Signed)
Can we call her back and confirm the pharmacy. I believe she has moved to [place]. Has she established with new provider up there? I had agreed to fill x 2 months in February. If she has not, please set up an e-visit with [place] to renew prescriptions for another three months.

## 2019-01-13 NOTE — Telephone Encounter (Signed)
Patient calling to get [facility] [address] [place], [place]. Patient states that she moved to [place] and has not found a new pcp. KW

## 2019-01-14 ENCOUNTER — Encounter: Payer: Self-pay | Admitting: Physician Assistant

## 2019-01-14 ENCOUNTER — Ambulatory Visit (INDEPENDENT_AMBULATORY_CARE_PROVIDER_SITE_OTHER): Payer: 59 | Admitting: Physician Assistant

## 2019-01-14 VITALS — BP 114/75

## 2019-01-14 DIAGNOSIS — I10 Essential (primary) hypertension: Secondary | ICD-10-CM | POA: Diagnosis not present

## 2019-01-14 DIAGNOSIS — F909 Attention-deficit hyperactivity disorder, unspecified type: Secondary | ICD-10-CM

## 2019-01-14 MED ORDER — AMLODIPINE BESYLATE 10 MG PO TABS: 10.0000 mg | ORAL_TABLET | Freq: Every day | ORAL | 3 refills | Status: AC

## 2019-01-14 NOTE — Telephone Encounter (Signed)
Scheduled patient for e-visit today

## 2019-01-14 NOTE — Progress Notes (Signed)
Patient: Tiffany Harrington Female    DOB: 02/15/1991   28 y.o.   MRN: [id number] Visit Date: 01/14/2019  Today's Provider: Trey SailorsAdriana M Pollak, PA-C   Chief Complaint  Patient presents with  . Follow-up   Subjective:    Virtual Visit via Video Note  I connected with Tiffany Harrington on 01/14/19 at  3:00 PM EDT by a video enabled telemedicine application and verified that I am speaking with the correct person using two identifiers.   I discussed the limitations of evaluation and management by telemedicine and the availability of in person appointments. The patient expressed understanding and agreed to proceed.  Patient location: home Provider location: [facility]  Persons involved in the visit: patient, provider    HPI  Hypertension, follow-up:  BP Readings from Last 3 Encounters:  01/14/19 114/75  10/14/18 (!) 148/86  09/11/18 (!) 140/104    She was last seen for hypertension 3 months ago. . Management since that visit includes Increasing amlodipine to 10 mg daily.. She reports excellent compliance with treatment. She is not having side effects.  She is exercising. She is adherent to low salt diet.   Outside blood pressures are 120/80's. She is experiencing none.  Patient denies chest pain, dyspnea and fatigue.   Cardiovascular risk factors include obesity (BMI >= 30 kg/m2).  Use of agents associated with hypertension: amphetamines.     Weight trend: decreasing steadily Wt Readings from Last 3 Encounters:  10/14/18 239 lb 6.4 oz (108.6 kg)  09/11/18 244 lb 12.8 oz (111 kg)  06/10/18 249 lb (112.9 kg)   Weight 220 lbs  Current diet: in general, a "healthy" diet    ------------------------------------------------------------------------  Follow up for ADD:  The patient was last seen for this 3 months ago. Changes made at last visit include none.  She reports excellent compliance with treatment. She feels that condition is Improved.  She is not having side effects.  ------------------------------------------------------------------------------------  No Known Allergies   Current Outpatient Medications:  .  amLODipine (NORVASC) 10 MG tablet, Take 1 tablet (10 mg total) by mouth daily., Disp: 90 tablet, Rfl: 0 .  amphetamine-dextroamphetamine (ADDERALL XR) 15 MG 24 hr capsule, Take 1 capsule by mouth every morning for 30 days., Disp: 30 capsule, Rfl: 0 .  norethindrone (CAMILA) 0.35 MG tablet, Take 1 tablet (0.35 mg total) by mouth daily., Disp: 3 Package, Rfl: 3  Review of Systems  Social History   Tobacco Use  . Smoking status: Never Smoker  . Smokeless tobacco: Never Used  Substance Use Topics  . Alcohol use: No    Frequency: Never      Objective:   BP 114/75  Vitals:   01/14/19 1359  BP: 114/75     Physical Exam      Assessment & Plan    1. Hypertension, unspecified type  She has lost about 30 lbs. She is exercising more and eating better.   - amLODipine (NORVASC) 10 MG tablet; Take 1 tablet (10 mg total) by mouth daily.  Dispense: 90 tablet; Refill: 3  2. Attention deficit hyperactivity disorder (ADHD), unspecified ADHD type  [place] has moved to [place] and is having trouble establishing with a new PCP due to COVID. Will refill adderall for the next four months.   The entirety of the information documented in the History of Present Illness, Review of Systems and Physical Exam were personally obtained by me. Portions of this information were initially documented by  Joseline Rosas, CMA and reviewed by me for thoroughness and accuracy.   F/u PRN.   I have spent 25 minutes with this patient, >50% of which was spent on counseling and coordination of care.         Trey Sailors, PA-C  [facility]

## 2019-01-14 NOTE — Telephone Encounter (Signed)
Scheduled for this afternoon

## 2019-01-23 NOTE — Patient Instructions (Signed)
Attention Deficit Hyperactivity Disorder, Adult Attention deficit hyperactivity disorder (ADHD) is a mental health disorder that starts during childhood. For many people with ADHD, the disorder continues into adult years. There are many things that you and your health care provider or therapist (mental health professional) can do to manage your symptoms. What are the causes? The exact cause of ADHD is not known. What increases the risk? You are more likely to develop this condition if:  You have a family history of ADHD.  You are female.  You were born to a mother who smoked or drank alcohol during pregnancy.  You were exposed to lead poisoning or other toxins in the womb or in early life.  You were born before 37 weeks of pregnancy (prematurely) or you had a low birth weight.  You have experienced a brain injury. What are the signs or symptoms? Symptoms of this condition depend on the type of ADHD. The two main types are inattentive and hyperactive-impulsive. Some people may have symptoms of both types. Symptoms of the inattentive type include:  Difficulty watching, listening, or thinking with focused effort (paying attention).  Making careless mistakes.  Not listening.  Not following instructions.  Being disorganized.  Avoiding tasks that require time and attention.  Losing things.  Forgetting things.  Being easily distracted. Symptoms of the hyperactive-impulsive type include:  Restlessness.  Talking too much.  Interrupting.  Difficulty with: ? Sitting still. ? Staying quiet. ? Feeling motivated. ? Relaxing. ? Waiting in line or waiting for a turn. How is this diagnosed? This condition is diagnosed based on your current symptoms and your history of symptoms. The diagnosis can be made by a provider such as a primary care provider, psychiatrist, psychologist, or clinical social worker. The provider may use a symptom checklist or a standardized behavior rating  scale to evaluate your symptoms. He or she may want to talk with family members who have known you for a long time and have observed your behaviors. There are no lab tests or brain imaging tests that can diagnose ADHD. How is this treated? This condition can be treated with medicines and behavior therapy. Medicines may be the best option to reduce impulsive behaviors and improve attention. Your health care provider may recommend:  Stimulant medicines. These are the most common medicines used for adult ADHD. They affect certain chemicals in the brain (neurotransmitters). These medicines may be long-acting or short-acting. This will determine how often you need to take the medicine.  A non-stimulant medicine for adult ADHD (atomoxetine). This medicine increases a neurotransmitter called norepinephrine. It may take weeks to months to see effects from this medicine. Psychotherapy and behavioral management are also important for treating ADHD. Psychotherapy is often used along with medicine. Your health care provider may suggest:  Cognitive behavioral therapy (CBT). This type of therapy teaches you to replace negative thoughts and actions with positive thoughts and actions. When used as part of ADHD treatment, this therapy may also include: ? Coping strategies for organization, time management, impulse control, and stress reduction. ? Mindfulness and meditation training.  Behavioral management. This may include strategies for organization and time management. You may work with an ADHD coach who is specially trained to help people with ADHD to manage and organize activities and to function more effectively. Follow these instructions at home: Medicines   Take over-the-counter and prescription medicines only as told by your health care provider.  Talk with your health care provider about the possible side effects of your   medicine to watch for. General instructions   Learn as much as you can about  adult ADHD, and work closely with your health care providers to find the treatments that work best for you.  Do not use drugs or abuse alcohol. Limit alcohol intake to no more than 1 drink a day for nonpregnant women and 2 drinks a day for men. One drink equals 12 oz of beer, 5 oz of wine, or 1 oz of hard liquor.  Follow the same schedule each day. Make sure your schedule includes enough time for you to get plenty of sleep.  Use reminder devices like notes, calendars, and phone apps to stay on-time and organized.  Eat a healthy diet. Do not skip meals.  Exercise regularly. Exercise can help to reduce stress and anxiety.  Keep all follow-up visits as told by your health care provider and therapist. This is important. Where to find more information  A health care provider may be able to recommend resources that are available online or over the phone. You could start with: ? Attention Deficit Disorder Association (ADDA): www.add.org ? National Institute of Mental Health (NIMH): www.nimh.nih.gov Contact a health care provider if:  Your symptoms are changing, getting worse, or not improving.  You have side effects from your medicine, such as: ? Repeated muscle twitches, coughing, or speech outbursts. ? Sleep problems. ? Loss of appetite. ? Depression. ? New or worsening behavior problems. ? Dizziness. ? Unusually fast heartbeat. ? Stomach pains. ? Headaches.  You are struggling with anxiety, depression, or substance abuse. Get help right away if:  You have a severe reaction to a medicine.  You have thoughts of hurting yourself or others. If you ever feel like you may hurt yourself or others, or have thoughts about taking your own life, get help right away. You can go to the nearest emergency department or call:  Your local emergency services (911 in the [place]  A suicide crisis helpline, such as the National Suicide Prevention Lifeline at 1-800-273-8255. This is open 24 hours a  day. Summary  ADHD is a mental health disorder that starts during childhood and often continues into adult years.  The exact cause of ADHD is not known.  There is no cure for ADHD, but treatment with medicine, therapy, or behavioral training can help you manage your condition. This information is not intended to replace advice given to you by your health care provider. Make sure you discuss any questions you have with your health care provider. Document Released: 04/12/2017 Document Revised: 04/12/2017 Document Reviewed: 04/12/2017 Elsevier Interactive Patient Education  2019 [employer]vier Inc.  

## 2019-07-04 IMAGING — DX DG CHEST 2V
2 series · 2 of 2 positions shown · non-contrast
Comparison: None.

CLINICAL DATA: Right chest pain.  Shortness of breath.

EXAM:
CHEST - 2 VIEW

[chest lat (1 of 2)]
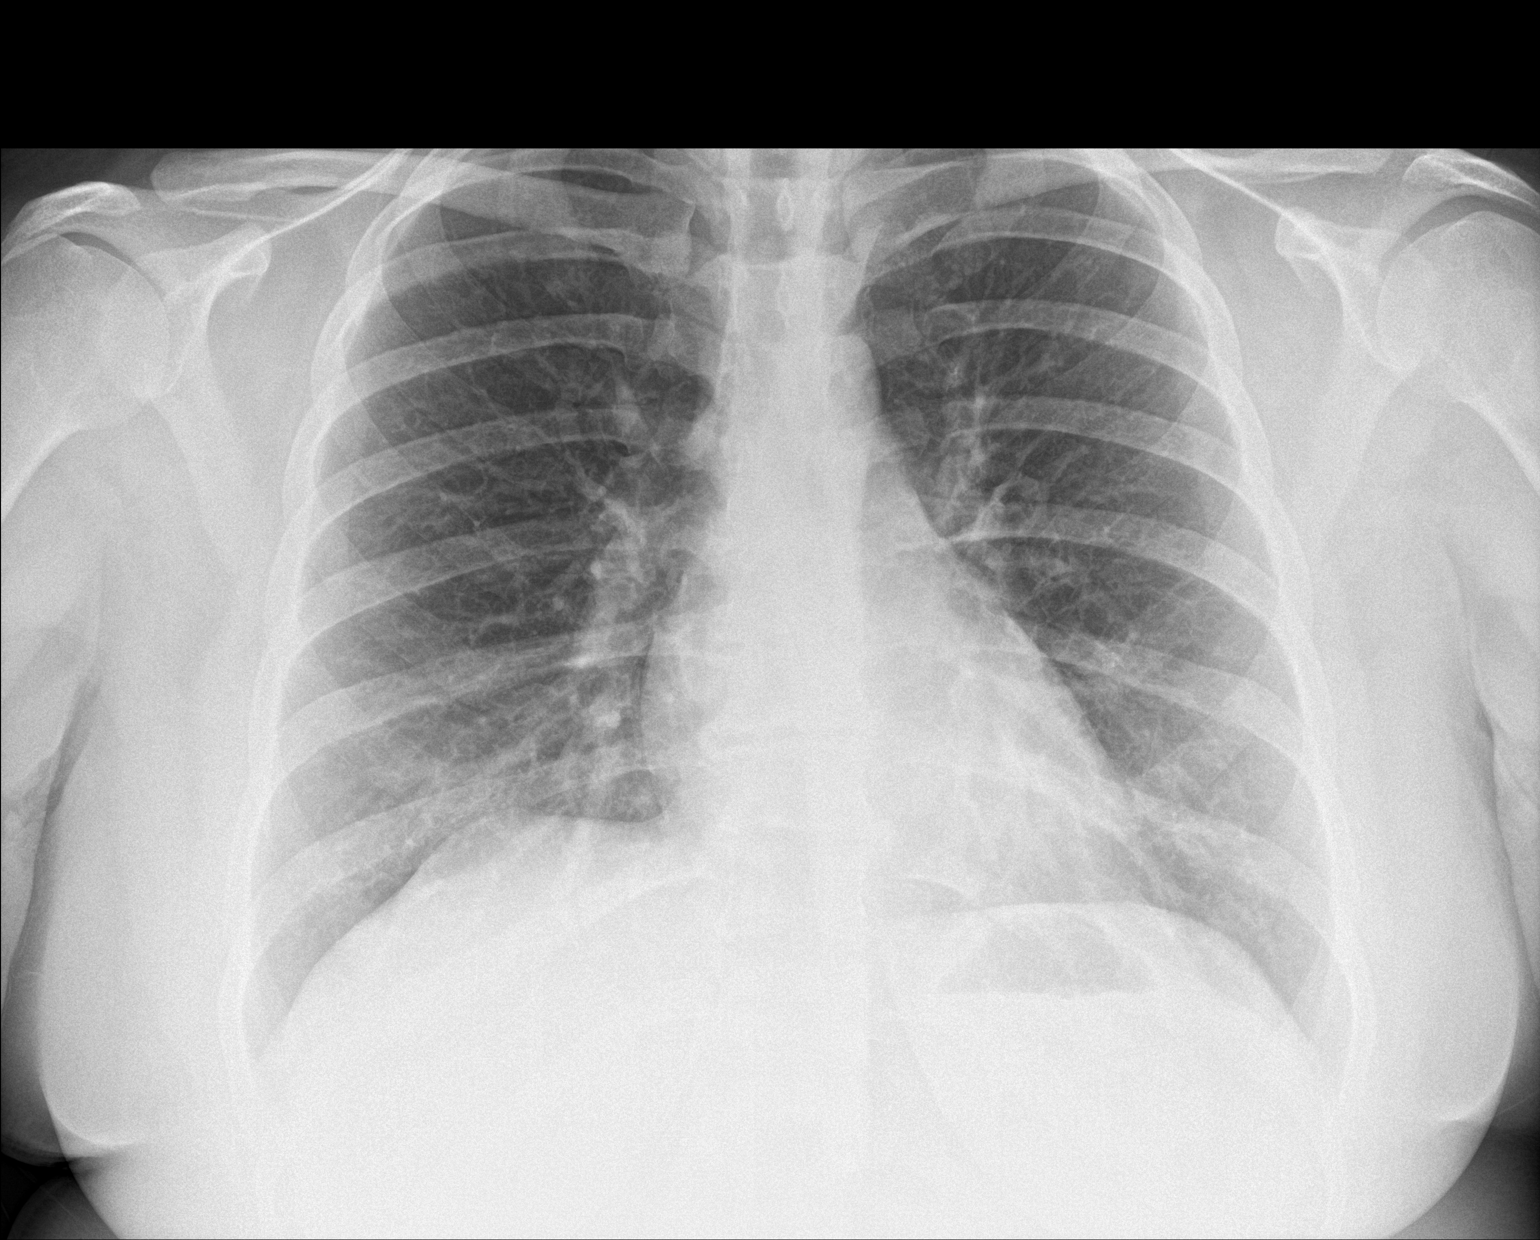

[chest lat (2 of 2)]
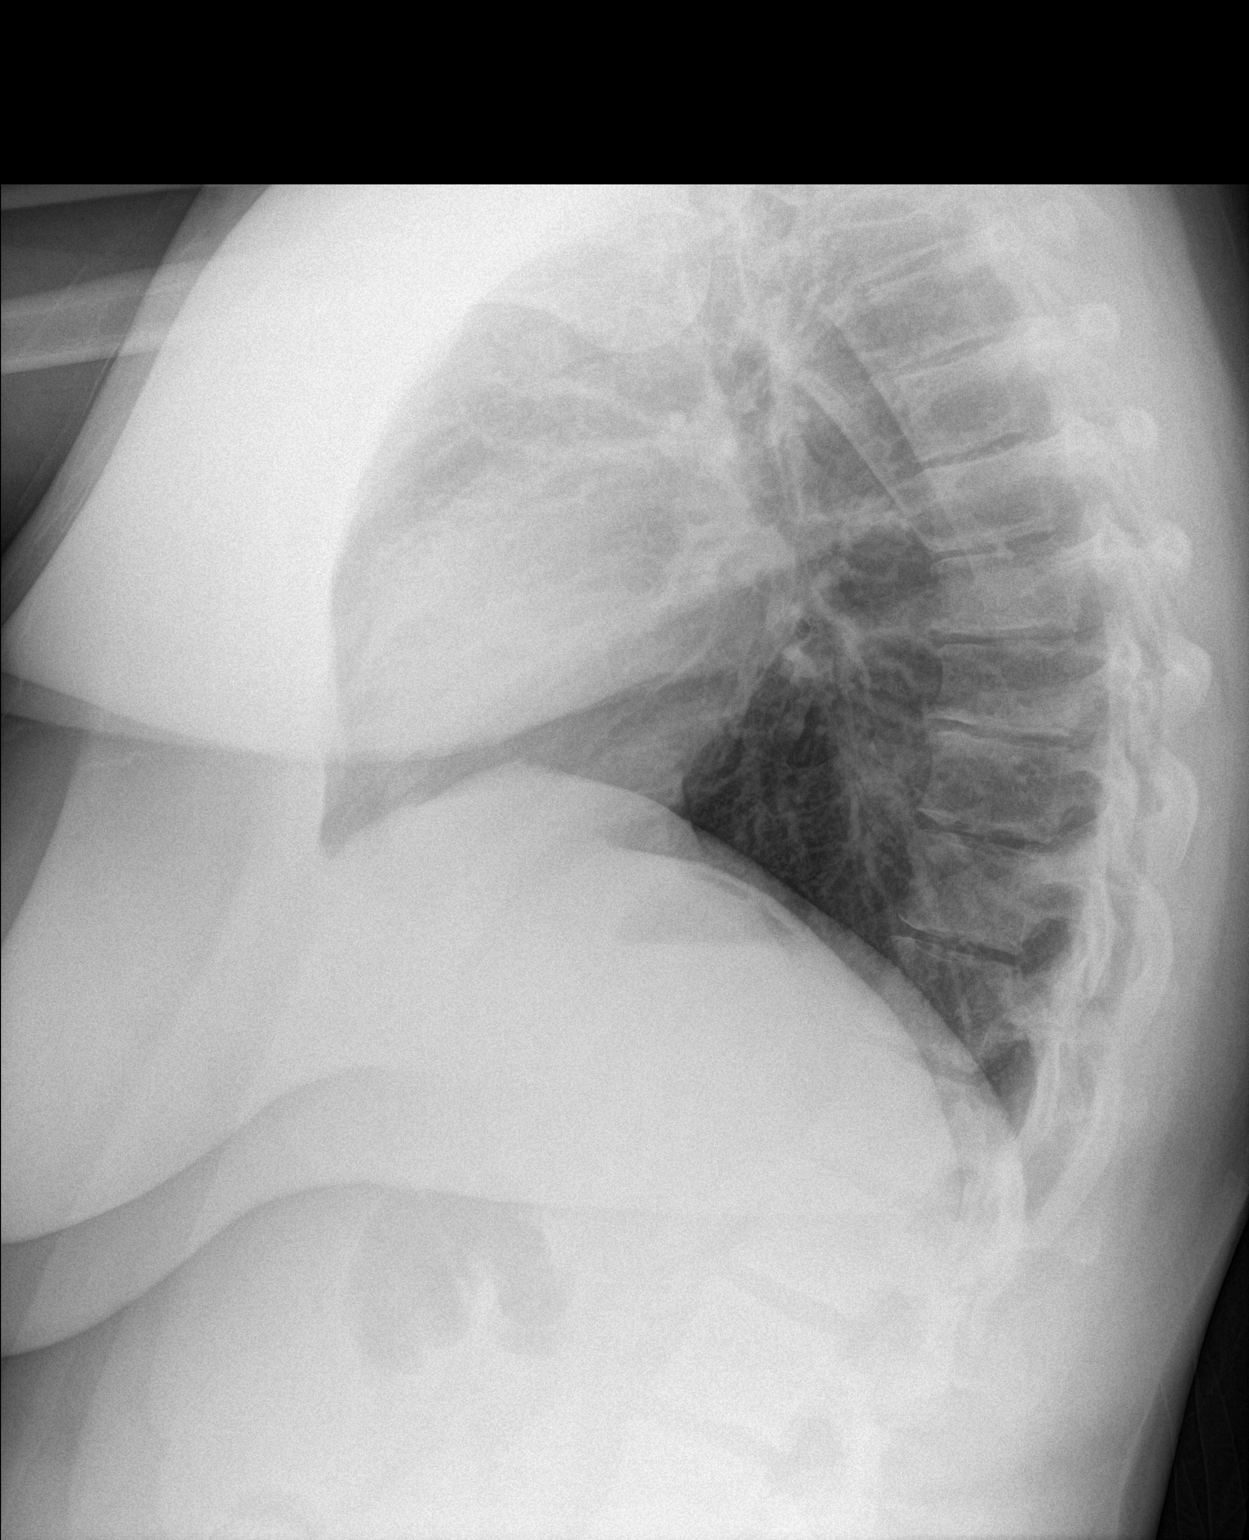

[2 of 2 positions shown; findings below may reference images not displayed]

FINDINGS: The heart, hila, and mediastinum are normal. No pneumothorax. No
pulmonary nodules or masses. Mild bibasilar atelectasis. No focal
infiltrate.
IMPRESSION: No active cardiopulmonary disease.

## 2019-08-17 IMAGING — US US ABDOMEN LIMITED
1 series · 14 of 25 positions shown · non-contrast
Comparison: None.

CLINICAL DATA: Right upper quadrant pain extending to the back over
the last 2-3 months.

EXAM:
ULTRASOUND ABDOMEN LIMITED RIGHT UPPER QUADRANT

[Series 1: us abdomen limited · 0.18mm/px · 14 of 50 slices shown]
[im 1/50]
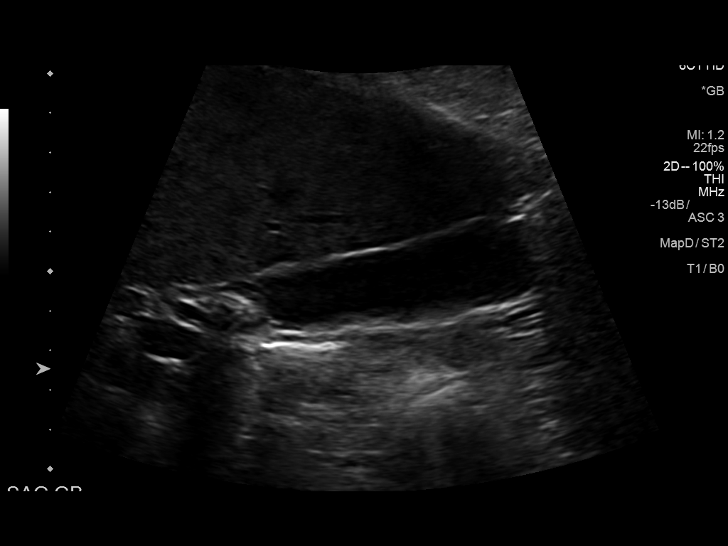
[im 5/50]
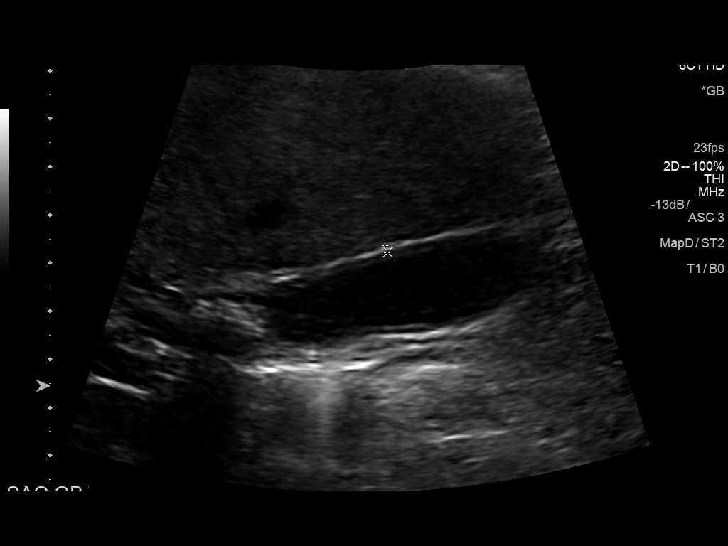
[im 9/50]
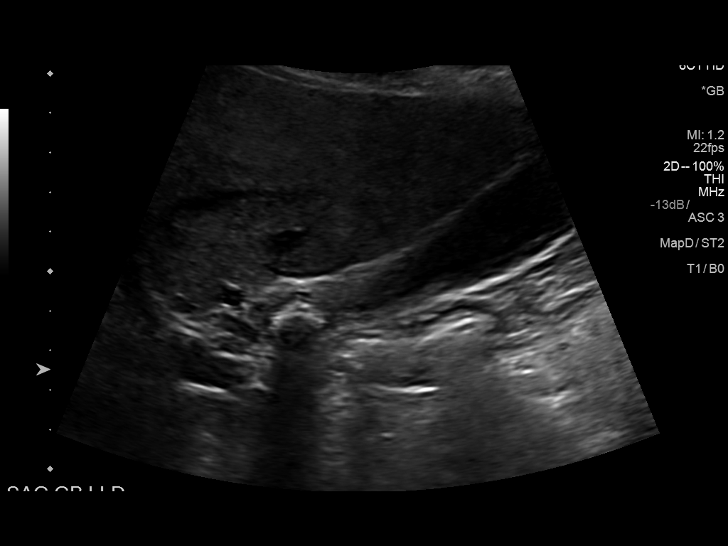
[im 13/50]
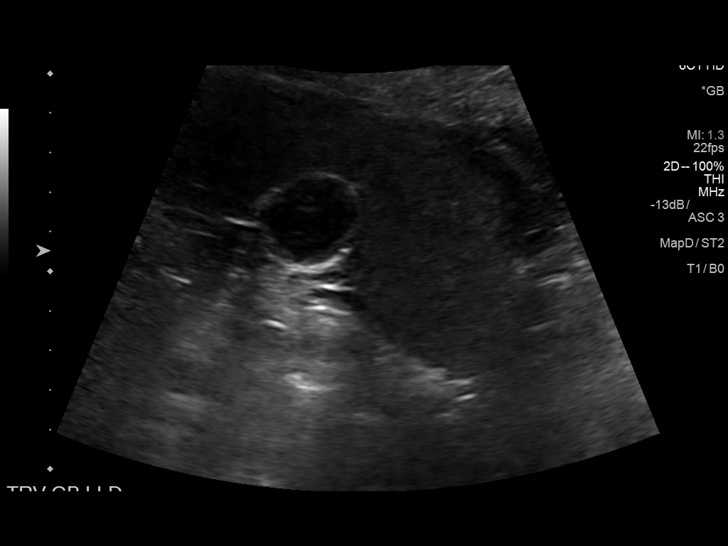
[im 17/50]
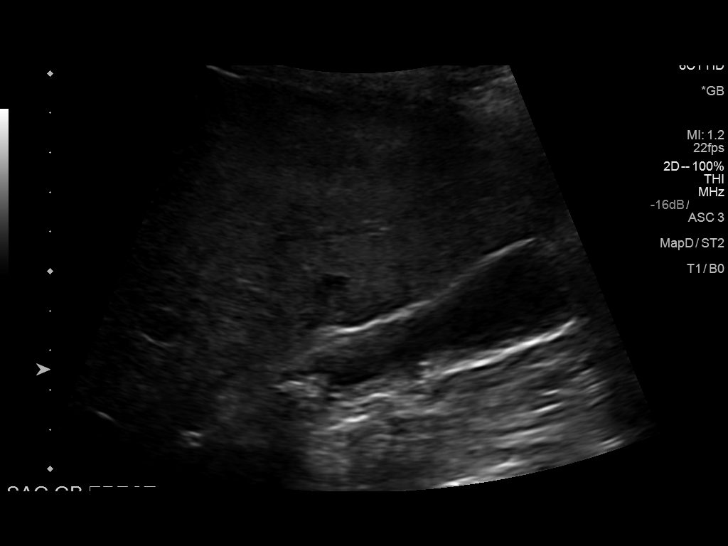
[im 19/50]
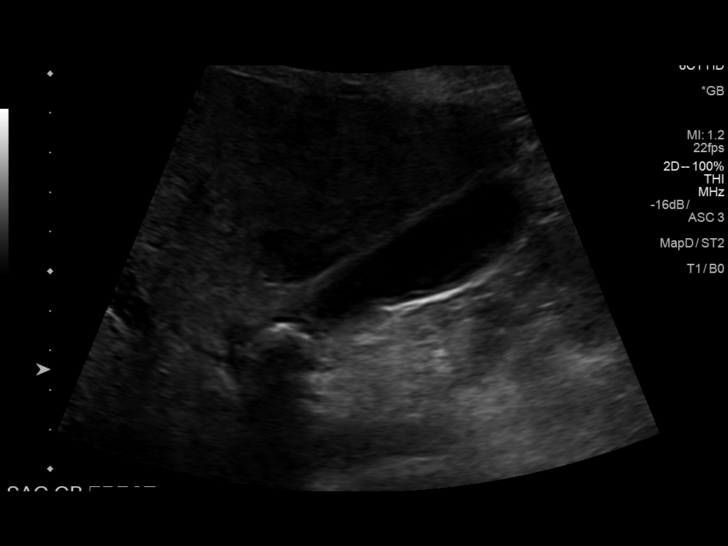
[im 23/50]
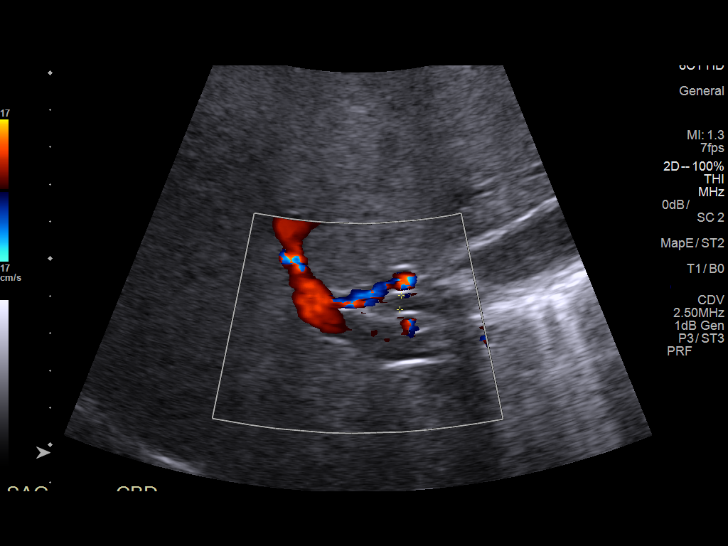
[im 27/50]
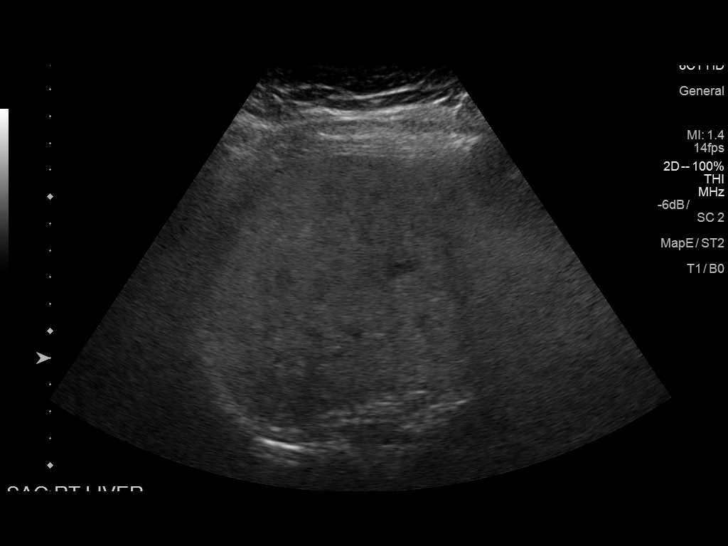
[im 31/50]
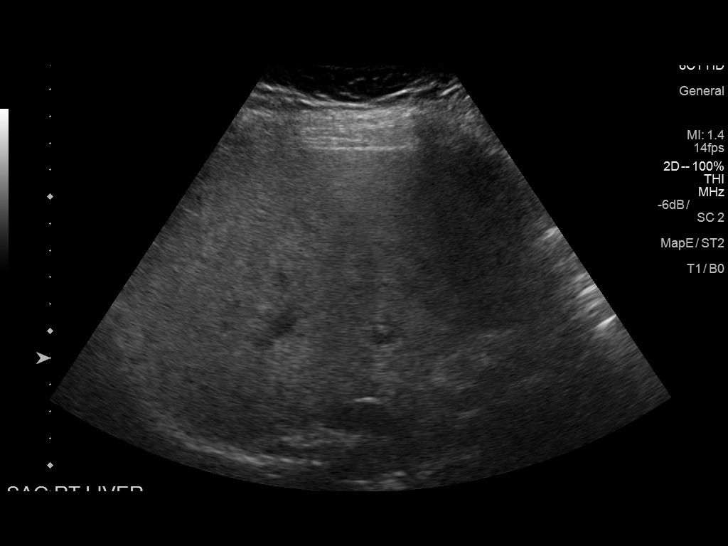
[im 33/50]
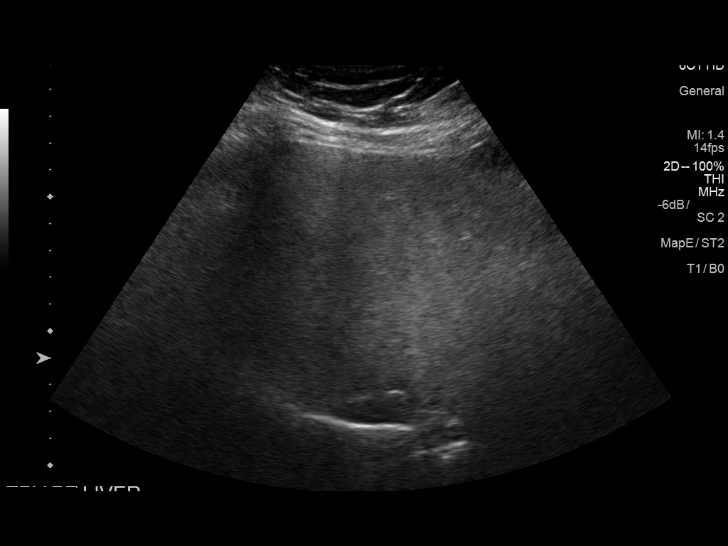
[im 37/50]
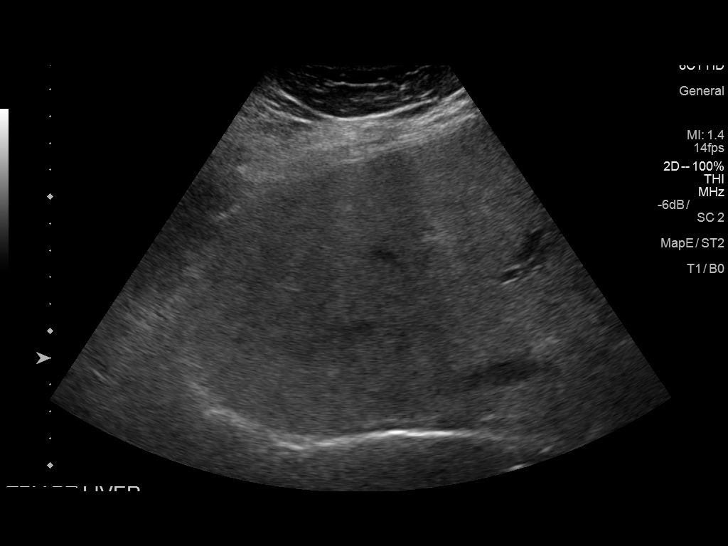
[im 41/50]
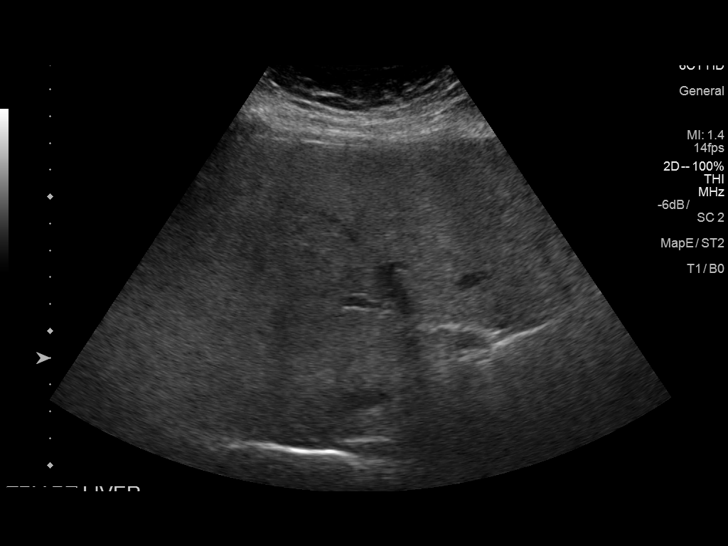
[im 45/50]
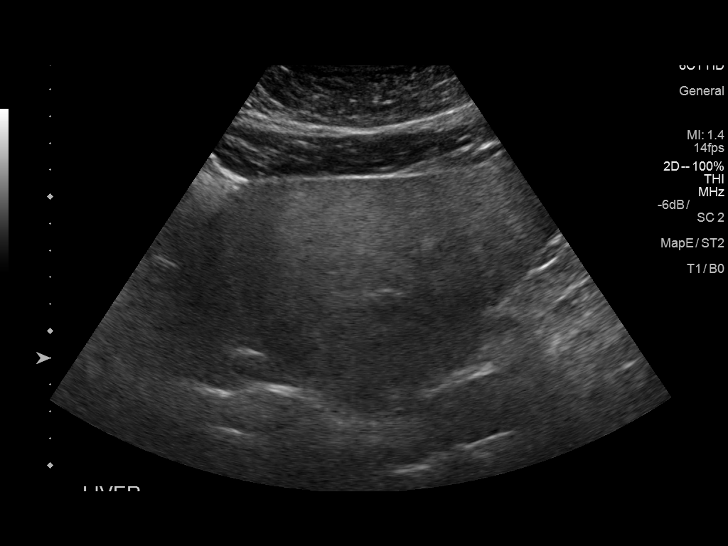
[im 50/50]
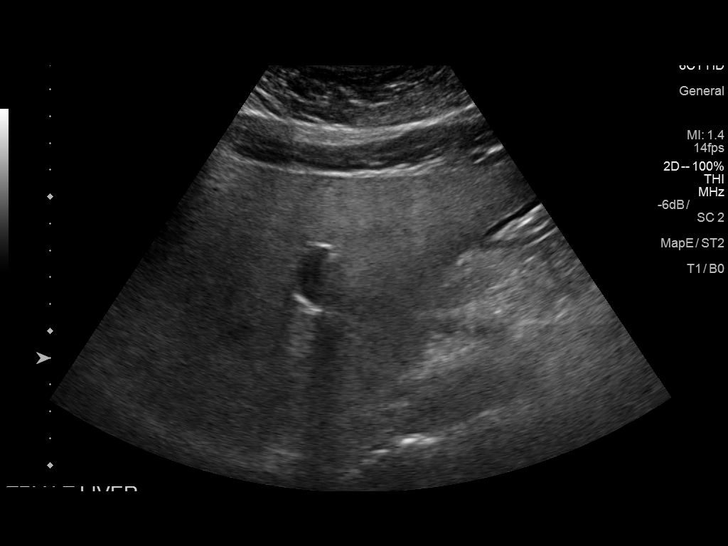

[14 of 25 positions shown; findings below may reference images not displayed]

FINDINGS: Gallbladder:

1.4 cm gallstone in the gallbladder neck that does not move with
change in positioning. Small amount of gallbladder sludge. Normal
wall thickness. Negative Murphy sign.

Common bile duct:

Diameter: 3.8 mm, normal

Liver:

Possible mild fatty change the liver. No focal lesion or
intrahepatic ductal dilatation. Portal vein is patent on color
Doppler imaging with normal direction of blood flow towards the
liver.
IMPRESSION: 1.4 cm gallstone in the gallbladder neck which does not move with
change in positioning. No ultrasound evidence of cholecystitis or
frank obstruction presently.
